# Patient Record
Sex: Male | Born: 2000 | Race: Black or African American | Hispanic: No | Marital: Single | Smoking: Never smoker
Health system: Southern US, Community
[De-identification: ages and names within clinical notes are randomized; demographics above are authoritative.]

## PROBLEM LIST (undated history)

## (undated) DIAGNOSIS — E119 Type 2 diabetes mellitus without complications: Secondary | ICD-10-CM

## (undated) DIAGNOSIS — I2699 Other pulmonary embolism without acute cor pulmonale: Secondary | ICD-10-CM

## (undated) HISTORY — DX: Type 2 diabetes mellitus without complications: E11.9

## (undated) HISTORY — DX: Other pulmonary embolism without acute cor pulmonale: I26.99

---
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
## 2018-04-03 ENCOUNTER — Emergency Department
Admission: EM | Admit: 2018-04-03 | Discharge: 2018-04-03 | Disposition: A | Discharge status: Home / Self Care | Payer: Medicaid Other | Attending: Emergency Medicine | Admitting: Emergency Medicine

## 2018-04-03 ENCOUNTER — Other Ambulatory Visit: Payer: Self-pay

## 2018-04-03 ENCOUNTER — Encounter: Payer: Self-pay | Admitting: Emergency Medicine

## 2018-04-03 DIAGNOSIS — R1032 Left lower quadrant pain: Secondary | ICD-10-CM

## 2018-04-03 DIAGNOSIS — Y929 Unspecified place or not applicable: Secondary | ICD-10-CM | POA: Insufficient documentation

## 2018-04-03 DIAGNOSIS — X500XXA Overexertion from strenuous movement or load, initial encounter: Secondary | ICD-10-CM | POA: Insufficient documentation

## 2018-04-03 DIAGNOSIS — Y990 Civilian activity done for income or pay: Secondary | ICD-10-CM | POA: Insufficient documentation

## 2018-04-03 DIAGNOSIS — S39011A Strain of muscle, fascia and tendon of abdomen, initial encounter: Secondary | ICD-10-CM | POA: Insufficient documentation

## 2018-04-03 DIAGNOSIS — Y9389 Activity, other specified: Secondary | ICD-10-CM | POA: Insufficient documentation

## 2018-04-03 LAB — URINALYSIS, COMPLETE (UACMP) WITH MICROSCOPIC
BACTERIA UA: NONE SEEN
BILIRUBIN URINE: NEGATIVE
Glucose, UA: NEGATIVE mg/dL
Hgb urine dipstick: NEGATIVE
KETONES UR: NEGATIVE mg/dL
Nitrite: NEGATIVE
Protein, ur: NEGATIVE mg/dL
Specific Gravity, Urine: 1.023 (ref 1.005–1.030)
pH: 7 (ref 5.0–8.0)

## 2018-04-03 LAB — COMPREHENSIVE METABOLIC PANEL
ALK PHOS: 68 U/L (ref 38–126)
ALT: 19 U/L (ref 0–44)
AST: 19 U/L (ref 15–41)
Albumin: 4.4 g/dL (ref 3.5–5.0)
Anion gap: 9 (ref 5–15)
BUN: 10 mg/dL (ref 6–20)
CHLORIDE: 104 mmol/L (ref 98–111)
CO2: 24 mmol/L (ref 22–32)
CREATININE: 0.62 mg/dL (ref 0.61–1.24)
Calcium: 9.7 mg/dL (ref 8.9–10.3)
GFR calc non Af Amer: >60 mL/min (ref >60)
GLUCOSE: 92 mg/dL (ref 70–99)
Potassium: 4.1 mmol/L (ref 3.5–5.1)
SODIUM: 137 mmol/L (ref 135–145)
Total Bilirubin: 0.7 mg/dL (ref 0.3–1.2)
Total Protein: 8.2 g/dL — ABNORMAL HIGH (ref 6.5–8.1)

## 2018-04-03 LAB — CBC
HEMATOCRIT: 45.9 % (ref 39.0–52.0)
Hemoglobin: 15.7 g/dL (ref 13.0–17.0)
MCH: 30.7 pg (ref 26.0–34.0)
MCHC: 34.2 g/dL (ref 30.0–36.0)
MCV: 89.8 fL (ref 80.0–100.0)
NRBC: 0 % (ref 0.0–0.2)
Platelets: 250 10*3/uL (ref 150–400)
RBC: 5.11 MIL/uL (ref 4.22–5.81)
RDW: 11.5 % (ref 11.5–15.5)
WBC: 4.8 10*3/uL (ref 4.0–10.5)

## 2018-04-03 LAB — LIPASE, BLOOD: LIPASE: 21 U/L (ref 11–51)

## 2018-04-03 NOTE — ED Provider Notes (Signed)
[facility] Emergency Department Provider Note  ____________________________________________  Time seen: Approximately 7:05 PM  I have reviewed the triage vital signs and the nursing notes.   HISTORY  Chief Complaint Abdominal Pain    HPI Tony Malone is a 18 y.o. male with no significant past medical history who complains of left lower quadrant abdominal pain that feels sharp, intermittent, worse with movement and twisting motion of the abdomen.  Started 3 days ago, waxing and waning since then.  No alleviating factors.  Associated with diarrhea that is not black or bloody.  Denies vomiting, normal oral intake.  No fevers chills or body aches.  No known sick contacts.  Pain started after he was reaching up high to grab a heavy object.  He works loading and unloading freight trucks.      History reviewed. No pertinent past medical history.   There are no active problems to display for this patient.    History reviewed. No pertinent surgical history.   Prior to Admission medications   Not on File   None  Allergies Patient has no known allergies.   No family history on file.  Social History Social History   Tobacco Use  . Smoking status: Never Smoker  Substance Use Topics  . Alcohol use: Not on file  . Drug use: No    Review of Systems  Constitutional:   No fever or chills.  ENT:   No sore throat. No rhinorrhea. Cardiovascular:   No chest pain or syncope. Respiratory:   No dyspnea or cough. Gastrointestinal: Positive as above for abdominal pain and diarrhea.  No vomiting Musculoskeletal:   Negative for focal pain or swelling All other systems reviewed and are negative except as documented above in ROS and HPI.  ____________________________________________   PHYSICAL EXAM:  VITAL SIGNS: ED Triage Vitals  Enc Vitals Group     BP 04/03/18 1518 138/79     Pulse Rate 04/03/18 1518 76     Resp 04/03/18 1518 17     Temp  04/03/18 1518 98.4 F (36.9 C)     Temp Source 04/03/18 1518 Oral     SpO2 04/03/18 1518 95 %     Weight 04/03/18 1517 290 lb (131.5 kg)     Height 04/03/18 1517 5\' 9"  (1.753 m)     Head Circumference --      Peak Flow --      Pain Score 04/03/18 1517 8     Pain Loc --      Pain Edu? --      Excl. in GC? --     Vital signs reviewed, nursing assessments reviewed.   Constitutional:   Alert and oriented. Non-toxic appearance. Eyes:   Conjunctivae are normal. EOMI. ENT      Head:   Normocephalic and atraumatic.      Mouth/Throat:   MMM      Neck:   No meningismus. Full ROM. Hematological/Lymphatic/Immunilogical:   No cervical lymphadenopathy. Cardiovascular:   RRR. Symmetric bilateral radial and DP pulses.  No murmurs. Cap refill less than 2 seconds. Respiratory:   Normal respiratory effort without tachypnea/retractions. Breath sounds are clear and equal bilaterally. No wheezes/rales/rhonchi. Gastrointestinal:   Soft with tenderness in the left lower quadrant.  There is no tenderness with superficial palpation of the soft tissues, no inflammatory changes of the abdominal wall.  No tenderness with deep palpation of the left lower quadrant structures.  Seems to localize to the abdominal wall muscular  layer.  Pain is reproducible with twisting motion or with having the patient flex the left hip against resistance.. Non distended. There is no CVA tenderness.  No rebound, rigidity, or guarding. Musculoskeletal:   Normal range of motion in all extremities.  No edema. Neurologic:   Normal speech and language.  Motor grossly intact. No acute focal neurologic deficits are appreciated.  Skin:    Skin is warm, dry and intact. No rash noted.  No wounds.  ____________________________________________    LABS (pertinent positives/negatives) (all labs ordered are listed, but only abnormal results are displayed) Labs Reviewed  COMPREHENSIVE METABOLIC PANEL - Abnormal; Notable for the following  components:      Result Value   Total Protein 8.2 (*)    All other components within normal limits  URINALYSIS, COMPLETE (UACMP) WITH MICROSCOPIC - Abnormal; Notable for the following components:   Color, Urine YELLOW (*)    APPearance CLEAR (*)    Leukocytes,Ua TRACE (*)    All other components within normal limits  LIPASE, BLOOD  CBC   ____________________________________________   EKG  ____________________________________________    RADIOLOGY  No results found.  ____________________________________________   PROCEDURES Procedures  ____________________________________________  CLINICAL IMPRESSION / ASSESSMENT AND PLAN / ED COURSE  Pertinent labs & imaging results that were available during my care of the patient were reviewed by me and considered in my medical decision making (see chart for details).    Patient presents with left lower quadrant abdominal pain.  Considering the patient's symptoms, medical history, and physical examination today, I have low suspicion for cholecystitis or biliary pathology, pancreatitis, perforation or bowel obstruction, hernia, intra-abdominal abscess, AAA or dissection, volvulus or intussusception, mesenteric ischemia, or appendicitis.  Likely abdominal wall strain, treat with NSAIDs and follow-up with primary care.  Work note provided.        ____________________________________________   FINAL CLINICAL IMPRESSION(S) / ED DIAGNOSES    Final diagnoses:  Left lower quadrant abdominal pain  Abdominal wall strain, initial encounter     ED Discharge Orders    None      Portions of this note were generated with dragon dictation software. Dictation errors may occur despite best attempts at proofreading.   Sharman Cheek, MD 04/03/18 Windell Moment

## 2018-04-03 NOTE — ED Triage Notes (Signed)
PT arrives with complaints of left lower quadrant pain that feels sharp. Pt states pain started 3 days ago. Pt also reports diarrhea.

## 2019-07-02 ENCOUNTER — Emergency Department: Payer: Self-pay

## 2019-07-02 ENCOUNTER — Other Ambulatory Visit: Payer: Self-pay

## 2019-07-02 ENCOUNTER — Emergency Department
Admission: EM | Admit: 2019-07-02 | Discharge: 2019-07-02 | Disposition: A | Discharge status: Home / Self Care | Payer: Self-pay | Attending: Emergency Medicine | Admitting: Emergency Medicine

## 2019-07-02 DIAGNOSIS — J209 Acute bronchitis, unspecified: Secondary | ICD-10-CM | POA: Insufficient documentation

## 2019-07-02 DIAGNOSIS — R079 Chest pain, unspecified: Secondary | ICD-10-CM

## 2019-07-02 LAB — CBC
HCT: 45.9 % (ref 39.0–52.0)
Hemoglobin: 15.4 g/dL (ref 13.0–17.0)
MCH: 30.6 pg (ref 26.0–34.0)
MCHC: 33.6 g/dL (ref 30.0–36.0)
MCV: 91.1 fL (ref 80.0–100.0)
Platelets: 286 10*3/uL (ref 150–400)
RBC: 5.04 MIL/uL (ref 4.22–5.81)
RDW: 11.9 % (ref 11.5–15.5)
WBC: 6.5 10*3/uL (ref 4.0–10.5)
nRBC: 0 % (ref 0.0–0.2)

## 2019-07-02 LAB — BASIC METABOLIC PANEL
Anion gap: 9 (ref 5–15)
BUN: 15 mg/dL (ref 6–20)
CO2: 25 mmol/L (ref 22–32)
Calcium: 9 mg/dL (ref 8.9–10.3)
Chloride: 102 mmol/L (ref 98–111)
Creatinine, Ser: 0.8 mg/dL (ref 0.61–1.24)
GFR calc Af Amer: >60 mL/min (ref >60)
GFR calc non Af Amer: >60 mL/min (ref >60)
Glucose, Bld: 117 mg/dL — ABNORMAL HIGH (ref 70–99)
Potassium: 3.9 mmol/L (ref 3.5–5.1)
Sodium: 136 mmol/L (ref 135–145)

## 2019-07-02 LAB — TROPONIN I (HIGH SENSITIVITY): Troponin I (High Sensitivity): 3 ng/L (ref <18)

## 2019-07-02 MED ORDER — PREDNISONE 50 MG PO TABS: 50.0000 mg | ORAL_TABLET | Freq: Every day | ORAL | 0 refills | Status: DC

## 2019-07-02 MED ORDER — PREDNISONE 20 MG PO TABS
50.0000 mg | ORAL_TABLET | Freq: Once | ORAL | Status: AC
  Administered 2019-07-02: 50 mg via ORAL
  Filled 2019-07-02: qty 3

## 2019-07-02 MED ORDER — IPRATROPIUM-ALBUTEROL 0.5-2.5 (3) MG/3ML IN SOLN
3.0000 mL | Freq: Once | RESPIRATORY_TRACT | Status: AC
  Administered 2019-07-02: 3 mL via RESPIRATORY_TRACT
  Filled 2019-07-02: qty 3

## 2019-07-02 MED ORDER — ALBUTEROL SULFATE HFA 108 (90 BASE) MCG/ACT IN AERS
2.0000 | INHALATION_SPRAY | Freq: Four times a day (QID) | RESPIRATORY_TRACT | 0 refills | Status: DC | PRN
Start: 2019-07-02 — End: 2021-04-08

## 2019-07-02 MED ORDER — SODIUM CHLORIDE 0.9% FLUSH: 3.0000 mL | Freq: Once | INTRAVENOUS | Status: DC

## 2019-07-02 NOTE — ED Triage Notes (Signed)
Pt presents with c/o SOB that has been going on for 3 days. Pt states cough and pain in chest when he takes deep breath.

## 2019-07-02 NOTE — ED Provider Notes (Signed)
[facility] Emergency Department Provider Note   ____________________________________________    I have reviewed the triage vital signs and the nursing notes.   HISTORY  Chief Complaint Shortness of Breath     HPI Tony Malone is a 19 y.o. male who presents with complaints of cough and mild shortness of breath with exertion over the last several days.  He does not smoke.  Does not have a history of asthma although does report a history of bronchitis 1 year ago that was similar to this.  Does not think that he has had a fever, no chills.  No nausea vomiting or chest pain.  No recent travel.  No calf pain or swelling  History reviewed. No pertinent past medical history.  There are no problems to display for this patient.   History reviewed. No pertinent surgical history.  Prior to Admission medications   Medication Sig Start Date End Date Taking? Authorizing Provider  albuterol (VENTOLIN HFA) 108 (90 Base) MCG/ACT inhaler Inhale 2 puffs into the lungs every 6 (six) hours as needed for wheezing or shortness of breath. 07/02/19   Lavonia Drafts, MD  predniSONE (DELTASONE) 50 MG tablet Take 1 tablet (50 mg total) by mouth daily with breakfast. 07/02/19   Lavonia Drafts, MD     Allergies Patient has no known allergies.  No family history on file.  Social History Social History   Tobacco Use  . Smoking status: Never Smoker  Substance Use Topics  . Alcohol use: Not on file  . Drug use: No    Review of Systems  Constitutional: No fever/chills Eyes: No visual changes.  ENT: No sore throat. Cardiovascular: As above Respiratory: As above Gastrointestinal: No abdominal pain.  Genitourinary: Negative for dysuria. Musculoskeletal: Negative for back pain. Skin: Negative for rash. Neurological: Negative for headaches   ____________________________________________   PHYSICAL EXAM:  VITAL SIGNS: ED Triage Vitals [07/02/19 1021]  Enc  Vitals Group     BP (!) 141/66     Pulse Rate 100     Resp 16     Temp 99.2 F (37.3 C)     Temp Source Oral     SpO2 95 %     Weight      Height      Head Circumference      Peak Flow      Pain Score 4     Pain Loc      Pain Edu?      Excl. in Day?     Constitutional: Alert and oriented.  Nose: No congestion/rhinnorhea. Mouth/Throat: Mucous membranes are moist.    Cardiovascular: Normal rate, regular rhythm. Grossly normal heart sounds.  Good peripheral circulation. Respiratory: Normal respiratory effort.  No retractions.  Scattered mild wheezing Gastrointestinal: Soft and nontender. No distention.  No CVA tenderness.  Musculoskeletal: No lower extremity tenderness nor edema.  Warm and well perfused Neurologic:  Normal speech and language. No gross focal neurologic deficits are appreciated.  Skin:  Skin is warm, dry and intact. No rash noted. Psychiatric: Mood and affect are normal. Speech and behavior are normal.  ____________________________________________   LABS (all labs ordered are listed, but only abnormal results are displayed)  Labs Reviewed  BASIC METABOLIC PANEL - Abnormal; Notable for the following components:      Result Value   Glucose, Bld 117 (*)    All other components within normal limits  CBC  TROPONIN I (HIGH SENSITIVITY)   ____________________________________________  EKG  ED ECG REPORT I, Jene Every, the attending physician, personally viewed and interpreted this ECG.  Date: 07/02/2019  Rhythm: Sinus tachycardia QRS Axis: normal Intervals: normal ST/T Wave abnormalities: normal Narrative Interpretation: no evidence of acute ischemia  ____________________________________________  RADIOLOGY  Chest x-ray reviewed by me, mild bronchitic changes ____________________________________________   PROCEDURES  Procedure(s) performed: No  Procedures   Critical Care performed:  No ____________________________________________   INITIAL IMPRESSION / ASSESSMENT AND PLAN / ED COURSE  Pertinent labs & imaging results that were available during my care of the patient were reviewed by me and considered in my medical decision making (see chart for details).  Patient overall well-appearing and in no acute distress vital signs are reassuring.  Scattered wheezes on exam.  Differential includes bronchospasm, bronchitis, pneumonia.  Lab work is quite reassuring, chest x-ray consistent with likely bronchitic changes, treated with DuoNeb with significant improvement, will start on steroids as well.  Outpatient follow-up, return precautions discussed    ____________________________________________   FINAL CLINICAL IMPRESSION(S) / ED DIAGNOSES  Final diagnoses:  Acute bronchitis with bronchospasm        Note:  This document was prepared using Dragon voice recognition software and may include unintentional dictation errors.   Jene Every, MD 07/02/19 1422

## 2019-07-02 NOTE — ED Notes (Signed)
See triage note  States he developed some SOB several days  States he had a fever at first   But is afebrile on arrival to ED

## 2021-04-04 ENCOUNTER — Emergency Department: Payer: Self-pay

## 2021-04-04 ENCOUNTER — Inpatient Hospital Stay
Admission: EM | Admit: 2021-04-04 | Discharge: 2021-04-08 | DRG: 163 | Disposition: A | Discharge status: Home / Self Care | Payer: Self-pay | Attending: Internal Medicine | Admitting: Internal Medicine

## 2021-04-04 ENCOUNTER — Encounter: Payer: Self-pay | Admitting: Emergency Medicine

## 2021-04-04 ENCOUNTER — Other Ambulatory Visit: Payer: Self-pay

## 2021-04-04 DIAGNOSIS — R739 Hyperglycemia, unspecified: Principal | ICD-10-CM

## 2021-04-04 DIAGNOSIS — E860 Dehydration: Secondary | ICD-10-CM | POA: Diagnosis present

## 2021-04-04 DIAGNOSIS — I2699 Other pulmonary embolism without acute cor pulmonale: Secondary | ICD-10-CM

## 2021-04-04 DIAGNOSIS — R079 Chest pain, unspecified: Secondary | ICD-10-CM | POA: Diagnosis present

## 2021-04-04 DIAGNOSIS — Z825 Family history of asthma and other chronic lower respiratory diseases: Secondary | ICD-10-CM

## 2021-04-04 DIAGNOSIS — M7989 Other specified soft tissue disorders: Secondary | ICD-10-CM | POA: Diagnosis present

## 2021-04-04 DIAGNOSIS — R3589 Other polyuria: Secondary | ICD-10-CM | POA: Diagnosis present

## 2021-04-04 DIAGNOSIS — D72829 Elevated white blood cell count, unspecified: Secondary | ICD-10-CM | POA: Diagnosis present

## 2021-04-04 DIAGNOSIS — R0902 Hypoxemia: Secondary | ICD-10-CM | POA: Diagnosis present

## 2021-04-04 DIAGNOSIS — E871 Hypo-osmolality and hyponatremia: Secondary | ICD-10-CM | POA: Diagnosis present

## 2021-04-04 DIAGNOSIS — R000 Tachycardia, unspecified: Secondary | ICD-10-CM

## 2021-04-04 DIAGNOSIS — E876 Hypokalemia: Secondary | ICD-10-CM

## 2021-04-04 DIAGNOSIS — E669 Obesity, unspecified: Secondary | ICD-10-CM

## 2021-04-04 DIAGNOSIS — R0602 Shortness of breath: Secondary | ICD-10-CM | POA: Diagnosis present

## 2021-04-04 DIAGNOSIS — Z20822 Contact with and (suspected) exposure to covid-19: Secondary | ICD-10-CM | POA: Diagnosis present

## 2021-04-04 DIAGNOSIS — I2609 Other pulmonary embolism with acute cor pulmonale: Principal | ICD-10-CM

## 2021-04-04 DIAGNOSIS — Z79899 Other long term (current) drug therapy: Secondary | ICD-10-CM

## 2021-04-04 DIAGNOSIS — Z7952 Long term (current) use of systemic steroids: Secondary | ICD-10-CM

## 2021-04-04 DIAGNOSIS — I513 Intracardiac thrombosis, not elsewhere classified: Secondary | ICD-10-CM

## 2021-04-04 DIAGNOSIS — E1110 Type 2 diabetes mellitus with ketoacidosis without coma: Secondary | ICD-10-CM | POA: Diagnosis present

## 2021-04-04 DIAGNOSIS — R6510 Systemic inflammatory response syndrome (SIRS) of non-infectious origin without acute organ dysfunction: Secondary | ICD-10-CM | POA: Diagnosis present

## 2021-04-04 DIAGNOSIS — M79606 Pain in leg, unspecified: Secondary | ICD-10-CM | POA: Diagnosis present

## 2021-04-04 DIAGNOSIS — Z6841 Body Mass Index (BMI) 40.0 and over, adult: Secondary | ICD-10-CM

## 2021-04-04 DIAGNOSIS — R631 Polydipsia: Secondary | ICD-10-CM | POA: Diagnosis present

## 2021-04-04 DIAGNOSIS — K219 Gastro-esophageal reflux disease without esophagitis: Secondary | ICD-10-CM | POA: Diagnosis present

## 2021-04-04 LAB — BASIC METABOLIC PANEL
Anion gap: 18 — ABNORMAL HIGH (ref 5–15)
Anion gap: 19 — ABNORMAL HIGH (ref 5–15)
BUN: 9 mg/dL (ref 6–20)
BUN: 8 mg/dL (ref 6–20)
CO2: 13 mmol/L — ABNORMAL LOW (ref 22–32)
CO2: 13 mmol/L — ABNORMAL LOW (ref 22–32)
Calcium: 9.1 mg/dL (ref 8.9–10.3)
Calcium: 8.8 mg/dL — ABNORMAL LOW (ref 8.9–10.3)
Chloride: 99 mmol/L (ref 98–111)
Chloride: 103 mmol/L (ref 98–111)
Creatinine, Ser: 0.73 mg/dL (ref 0.61–1.24)
Creatinine, Ser: 0.8 mg/dL (ref 0.61–1.24)
GFR, Estimated: >60 mL/min (ref >60)
GFR, Estimated: >60 mL/min (ref >60)
Glucose, Bld: 347 mg/dL — ABNORMAL HIGH (ref 70–99)
Glucose, Bld: 283 mg/dL — ABNORMAL HIGH (ref 70–99)
Potassium: 4.1 mmol/L (ref 3.5–5.1)
Potassium: 3.9 mmol/L (ref 3.5–5.1)
Sodium: 130 mmol/L — ABNORMAL LOW (ref 135–145)
Sodium: 135 mmol/L (ref 135–145)

## 2021-04-04 LAB — BLOOD GAS, VENOUS
Acid-base deficit: 12.7 mmol/L — ABNORMAL HIGH (ref 0.0–2.0)
Bicarbonate: 13.9 mmol/L — ABNORMAL LOW (ref 20.0–28.0)
O2 Saturation: 81.7 %
Patient temperature: 37
pCO2, Ven: 34 mmHg — ABNORMAL LOW (ref 44–60)
pH, Ven: 7.22 — ABNORMAL LOW (ref 7.25–7.43)
pO2, Ven: 53 mmHg — ABNORMAL HIGH (ref 32–45)

## 2021-04-04 LAB — GLUCOSE, CAPILLARY
Glucose-Capillary: 199 mg/dL — ABNORMAL HIGH (ref 70–99)
Glucose-Capillary: 210 mg/dL — ABNORMAL HIGH (ref 70–99)
Glucose-Capillary: 281 mg/dL — ABNORMAL HIGH (ref 70–99)

## 2021-04-04 LAB — CBC WITH DIFFERENTIAL/PLATELET
Abs Immature Granulocytes: 0.15 10*3/uL — ABNORMAL HIGH (ref 0.00–0.07)
Basophils Absolute: 0.1 10*3/uL (ref 0.0–0.1)
Basophils Relative: 1 %
Eosinophils Absolute: 0.1 10*3/uL (ref 0.0–0.5)
Eosinophils Relative: 1 %
HCT: 46.4 % (ref 39.0–52.0)
Hemoglobin: 15.6 g/dL (ref 13.0–17.0)
Immature Granulocytes: 1 %
Lymphocytes Relative: 25 %
Lymphs Abs: 2.9 10*3/uL (ref 0.7–4.0)
MCH: 29.9 pg (ref 26.0–34.0)
MCHC: 33.6 g/dL (ref 30.0–36.0)
MCV: 88.9 fL (ref 80.0–100.0)
Monocytes Absolute: 1 10*3/uL (ref 0.1–1.0)
Monocytes Relative: 8 %
Neutro Abs: 7.7 10*3/uL (ref 1.7–7.7)
Neutrophils Relative %: 64 %
Platelets: 218 10*3/uL (ref 150–400)
RBC: 5.22 MIL/uL (ref 4.22–5.81)
RDW: 12 % (ref 11.5–15.5)
WBC: 11.8 10*3/uL — ABNORMAL HIGH (ref 4.0–10.5)
nRBC: 0 % (ref 0.0–0.2)

## 2021-04-04 LAB — CBC
HCT: 48.1 % (ref 39.0–52.0)
Hemoglobin: 16.2 g/dL (ref 13.0–17.0)
MCH: 29.7 pg (ref 26.0–34.0)
MCHC: 33.7 g/dL (ref 30.0–36.0)
MCV: 88.3 fL (ref 80.0–100.0)
Platelets: 211 10*3/uL (ref 150–400)
RBC: 5.45 MIL/uL (ref 4.22–5.81)
RDW: 11.9 % (ref 11.5–15.5)
WBC: 11.8 10*3/uL — ABNORMAL HIGH (ref 4.0–10.5)
nRBC: 0 % (ref 0.0–0.2)

## 2021-04-04 LAB — HEPATIC FUNCTION PANEL
ALT: 39 U/L (ref 0–44)
AST: 20 U/L (ref 15–41)
Albumin: 3.9 g/dL (ref 3.5–5.0)
Alkaline Phosphatase: 81 U/L (ref 38–126)
Bilirubin, Direct: 0.1 mg/dL (ref 0.0–0.2)
Indirect Bilirubin: 1 mg/dL — ABNORMAL HIGH (ref 0.3–0.9)
Total Bilirubin: 1.1 mg/dL (ref 0.3–1.2)
Total Protein: 8.9 g/dL — ABNORMAL HIGH (ref 6.5–8.1)

## 2021-04-04 LAB — URINALYSIS, COMPLETE (UACMP) WITH MICROSCOPIC
Bacteria, UA: NONE SEEN
Bilirubin Urine: NEGATIVE
Glucose, UA: >=500 mg/dL — AB
Hgb urine dipstick: NEGATIVE
Ketones, ur: 80 mg/dL — AB
Leukocytes,Ua: NEGATIVE
Nitrite: NEGATIVE
Protein, ur: 100 mg/dL — AB
Specific Gravity, Urine: 1.026 (ref 1.005–1.030)
pH: 5 (ref 5.0–8.0)

## 2021-04-04 LAB — BETA-HYDROXYBUTYRIC ACID
Beta-Hydroxybutyric Acid: 5.47 mmol/L — ABNORMAL HIGH (ref 0.05–0.27)
Beta-Hydroxybutyric Acid: 5.49 mmol/L — ABNORMAL HIGH (ref 0.05–0.27)

## 2021-04-04 LAB — TSH: TSH: 1.455 u[IU]/mL (ref 0.350–4.500)

## 2021-04-04 LAB — T4, FREE: Free T4: 0.94 ng/dL (ref 0.61–1.12)

## 2021-04-04 LAB — CBG MONITORING, ED: Glucose-Capillary: 309 mg/dL — ABNORMAL HIGH (ref 70–99)

## 2021-04-04 LAB — TROPONIN I (HIGH SENSITIVITY)
Troponin I (High Sensitivity): 6 ng/L (ref <18)
Troponin I (High Sensitivity): 6 ng/L (ref <18)

## 2021-04-04 LAB — RESP PANEL BY RT-PCR (FLU A&B, COVID) ARPGX2
Influenza A by PCR: NEGATIVE
Influenza B by PCR: NEGATIVE
SARS Coronavirus 2 by RT PCR: NEGATIVE

## 2021-04-04 LAB — D-DIMER, QUANTITATIVE: D-Dimer, Quant: 2.25 ug/mL-FEU — ABNORMAL HIGH (ref 0.00–0.50)

## 2021-04-04 LAB — PROCALCITONIN: Procalcitonin: <0.1 ng/mL

## 2021-04-04 LAB — BRAIN NATRIURETIC PEPTIDE: B Natriuretic Peptide: 5.3 pg/mL (ref 0.0–100.0)

## 2021-04-04 MED ORDER — HEPARIN BOLUS VIA INFUSION
5000.0000 [IU] | Freq: Once | INTRAVENOUS | Status: DC
Start: 2021-04-04 — End: 2021-04-04
  Filled 2021-04-04: qty 5000

## 2021-04-04 MED ORDER — HEPARIN (PORCINE) 25000 UT/250ML-% IV SOLN: 1600.0000 [IU]/h | INTRAVENOUS | Status: DC

## 2021-04-04 MED ORDER — ENOXAPARIN SODIUM 80 MG/0.8ML IJ SOSY
0.5000 mg/kg | PREFILLED_SYRINGE | INTRAMUSCULAR | Status: DC
  Filled 2021-04-04: qty 0.65

## 2021-04-04 MED ORDER — POTASSIUM CHLORIDE 10 MEQ/100ML IV SOLN: 10.0000 meq | INTRAVENOUS | Status: DC

## 2021-04-04 MED ORDER — ACETAMINOPHEN 500 MG PO TABS
1000.0000 mg | ORAL_TABLET | Freq: Once | ORAL | Status: AC
Start: 2021-04-05 — End: 2021-04-04
  Administered 2021-04-04: 1000 mg via ORAL
  Filled 2021-04-04: qty 2

## 2021-04-04 MED ORDER — LACTATED RINGERS IV BOLUS
20.0000 mL/kg | Freq: Once | INTRAVENOUS | Status: AC
  Administered 2021-04-04: 2630 mL via INTRAVENOUS

## 2021-04-04 MED ORDER — INSULIN REGULAR(HUMAN) IN NACL 100-0.9 UT/100ML-% IV SOLN
INTRAVENOUS | Status: DC
  Administered 2021-04-04: 19 [IU]/h via INTRAVENOUS
  Administered 2021-04-05: 14 [IU]/h via INTRAVENOUS
  Administered 2021-04-05: 9.5 [IU]/h via INTRAVENOUS
  Filled 2021-04-04 (×3): qty 100

## 2021-04-04 MED ORDER — HEPARIN SODIUM (PORCINE) 5000 UNIT/ML IJ SOLN: 5000.0000 [IU] | Freq: Three times a day (TID) | INTRAMUSCULAR | Status: DC

## 2021-04-04 MED ORDER — PANTOPRAZOLE SODIUM 40 MG IV SOLR
40.0000 mg | Freq: Two times a day (BID) | INTRAVENOUS | Status: DC
  Administered 2021-04-04 – 2021-04-06 (×4): 40 mg via INTRAVENOUS
  Filled 2021-04-04 (×4): qty 10

## 2021-04-04 MED ORDER — LACTATED RINGERS IV SOLN: INTRAVENOUS | Status: DC

## 2021-04-04 MED ORDER — DEXTROSE IN LACTATED RINGERS 5 % IV SOLN
INTRAVENOUS | Status: DC
  Administered 2021-04-05: 125 mL/h via INTRAVENOUS

## 2021-04-04 MED ORDER — INSULIN REGULAR(HUMAN) IN NACL 100-0.9 UT/100ML-% IV SOLN: INTRAVENOUS | Status: DC

## 2021-04-04 MED ORDER — CHLORHEXIDINE GLUCONATE CLOTH 2 % EX PADS
6.0000 | MEDICATED_PAD | Freq: Every day | CUTANEOUS | Status: DC
  Administered 2021-04-04 – 2021-04-07 (×4): 6 via TOPICAL

## 2021-04-04 MED ORDER — IPRATROPIUM-ALBUTEROL 0.5-2.5 (3) MG/3ML IN SOLN
3.0000 mL | Freq: Once | RESPIRATORY_TRACT | Status: AC
  Administered 2021-04-04: 3 mL via RESPIRATORY_TRACT
  Filled 2021-04-04: qty 3

## 2021-04-04 MED ORDER — SODIUM CHLORIDE 0.9 % IV BOLUS
1000.0000 mL | Freq: Once | INTRAVENOUS | Status: AC
  Administered 2021-04-04: 1000 mL via INTRAVENOUS

## 2021-04-04 MED ORDER — POTASSIUM CHLORIDE 10 MEQ/100ML IV SOLN
10.0000 meq | INTRAVENOUS | Status: AC
  Administered 2021-04-04: 10 meq via INTRAVENOUS
  Filled 2021-04-04: qty 100

## 2021-04-04 MED ORDER — DEXTROSE IN LACTATED RINGERS 5 % IV SOLN: INTRAVENOUS | Status: DC

## 2021-04-04 MED ORDER — DEXTROSE 50 % IV SOLN: 0.0000 mL | INTRAVENOUS | Status: DC | PRN

## 2021-04-04 NOTE — ED Notes (Signed)
Provider Harrison, PA okayed not collecting second troponin on this patient. ?

## 2021-04-04 NOTE — ED Notes (Signed)
IV team present  

## 2021-04-04 NOTE — ED Provider Notes (Signed)
? ? ?[facility] ?Emergency Department Provider Note ? ? ? ? Event Date/Time  ? First MD Initiated Contact with Patient 04/04/21 1627   ?  (approximate) ? ? ?History  ? ?Chest Pain and Shortness of Breath ? ? ?HPI ? ?Tony Malone is a 21 y.o. male with noncontributory medical history, presents to the ED for evaluation of central chest pain with some referral to his back, aggravated by inhalation.  Patient denies any history of asthma, COPD, or CHF.  With history of acute bronchitis treated in the past.  Patient denies any recent fevers, chills, cough, or shortness of breath.  He does admit to some episodic nonbloody, nonbilious vomiting yesterday.  Patient would also endorse that last week he was experiencing some increased fatigue, polyuria, nocturia and polydipsia.  Patient presents today in no acute distress for evaluation of his symptoms. ?  ? ? ?Physical Exam  ? ?Triage Vital Signs: ?ED Triage Vitals  ?Enc Vitals Group  ?   BP 04/04/21 1348 (!) 149/95  ?   Pulse Rate 04/04/21 1348 (!) 112  ?   Resp 04/04/21 1348 20  ?   Temp 04/04/21 1348 98.1 ?F (36.7 ?C)  ?   Temp Source 04/04/21 1348 Oral  ?   SpO2 04/04/21 1348 93 %  ?   Weight 04/04/21 1344 289 lb 14.5 oz (131.5 kg)  ?   Height 04/04/21 1344 5\' 9"  (1.753 m)  ?   Head Circumference --   ?   Peak Flow --   ?   Pain Score 04/04/21 1344 8  ?   Pain Loc --   ?   Pain Edu? --   ?   Excl. in GC? --   ? ? ?Most recent vital signs: ?Vitals:  ? 04/04/21 1926 04/04/21 2000  ?BP: (!) 145/95 (!) 163/92  ?Pulse: (!) 122 (!) 112  ?Resp: (!) 24 (!) 22  ?Temp:    ?SpO2: 98% 99%  ? ? ?General Awake, no distress.  ?CV:  Good peripheral perfusion. Tachy rate ?RESP:  Normal effort. CTA ?ABD:  No distention. Soft, nontender ? ?ED Results / Procedures / Treatments  ? ?Labs ?(all labs ordered are listed, but only abnormal results are displayed) ?Labs Reviewed  ?BASIC METABOLIC PANEL - Abnormal; Notable for the following components:  ?    Result Value  ?  Sodium 130 (*)   ? CO2 13 (*)   ? Glucose, Bld 347 (*)   ? Anion gap 18 (*)   ? All other components within normal limits  ?CBC - Abnormal; Notable for the following components:  ? WBC 11.8 (*)   ? All other components within normal limits  ?HEPATIC FUNCTION PANEL - Abnormal; Notable for the following components:  ? Total Protein 8.9 (*)   ? Indirect Bilirubin 1.0 (*)   ? All other components within normal limits  ?BLOOD GAS, VENOUS - Abnormal; Notable for the following components:  ? pH, Ven 7.22 (*)   ? pCO2, Ven 34 (*)   ? pO2, Ven 53 (*)   ? Bicarbonate 13.9 (*)   ? Acid-base deficit 12.7 (*)   ? All other components within normal limits  ?RESP PANEL BY RT-PCR (FLU A&B, COVID) ARPGX2  ?HEMOGLOBIN A1C  ?URINALYSIS, COMPLETE (UACMP) WITH MICROSCOPIC  ?BETA-HYDROXYBUTYRIC ACID  ?CBC WITH DIFFERENTIAL/PLATELET  ?URINALYSIS, ROUTINE W REFLEX MICROSCOPIC  ?CBG MONITORING, ED  ?TROPONIN I (HIGH SENSITIVITY)  ?TROPONIN I (HIGH SENSITIVITY)  ? ? ? ?EKG ? ?  Vent. rate 111 BPM ?PR interval 130 ms ?QRS duration 88 ms ?QT/QTcB 338/459 ms ?P-R-T axes 54 50 25 ?Tachy rate ?Normal axis ?No STEMI ? ?RADIOLOGY ? ?I personally viewed and evaluated these images as part of my medical decision making, as well as reviewing the written report by the radiologist. ? ?ED Provider Interpretation: no acute findings} ? ?DG Chest 2 View ? ?Result Date: 04/04/2021 ?CLINICAL DATA:  Shortness of breath and chest pain. EXAM: CHEST - 2 VIEW COMPARISON:  07/02/2019 FINDINGS: Cardiac silhouette and mediastinal contours are within normal limits. The lungs are clear. No pleural effusion or pneumothorax. No acute skeletal abnormality. IMPRESSION: No active cardiopulmonary disease. Electronically Signed   By: Neita Garnet M.D.   On: 04/04/2021 14:45   ? ? ?PROCEDURES: ? ?Critical Care performed: Yes, see critical care procedure note(s) ? ?Procedures ?CRITICAL CARE ?Performed by: Lissa Hoard ? ? ?Total critical care time: 25 minutes ? ?Critical  care time was exclusive of separately billable procedures and treating other patients. ? ?Critical care was necessary to treat or prevent imminent or life-threatening deterioration. ? ?Critical care was time spent personally by me on the following activities: development of treatment plan with patient and/or surrogate as well as nursing, discussions with consultants, evaluation of patient's response to treatment, examination of patient, obtaining history from patient or surrogate, ordering and performing treatments and interventions, ordering and review of laboratory studies, ordering and review of radiographic studies, pulse oximetry and re-evaluation of patient's condition. ? ? ? ?MEDICATIONS ORDERED IN ED: ?Medications  ?insulin regular, human (MYXREDLIN) 100 units/ 100 mL infusion (has no administration in time range)  ?lactated ringers infusion (has no administration in time range)  ?dextrose 5 % in lactated ringers infusion (has no administration in time range)  ?dextrose 50 % solution 0-50 mL (has no administration in time range)  ?potassium chloride 10 mEq in 100 mL IVPB (has no administration in time range)  ?ipratropium-albuterol (DUONEB) 0.5-2.5 (3) MG/3ML nebulizer solution 3 mL (3 mLs Nebulization Given 04/04/21 1758)  ?sodium chloride 0.9 % bolus 1,000 mL (0 mLs Intravenous Stopped 04/04/21 2004)  ?lactated ringers bolus 2,630 mL (2,630 mLs Intravenous New Bag/Given 04/04/21 2010)  ? ? ? ?IMPRESSION / MDM / ASSESSMENT AND PLAN / ED COURSE  ?I reviewed the triage vital signs and the nursing notes. ?             ?               ? ?Differential diagnosis includes, but is not limited to, hyperglycemia, DKA, HHS, sepsis ? ?The patient is on the cardiac monitor to evaluate for evidence of arrhythmia and/or significant heart rate changes. ? ?Patient to the ED for evaluation of several days of generalized weakness, shortness of breath, and fatigue.  Patient's initial evaluation does reveal a significant  hyperglycemia at 347, anion gap of 18, and a pH of 7.22.  Patient reports has been n.p.o. since yesterday secondary to anorexia and some nausea and vomiting.  Patient's diagnosis is consistent with DKA and what appears to be a new onset type II diabetic. Patient will be mid to the hospital service Renaldo Reel, MD) for further management including insulin infusion.  Patient is is agreeable to the plan of care and admission. ? ?Clinical Course as of 04/04/21 2032  ?Wed Apr 04, 2021  ?1909 Glucose(!): 347 [JM]  ?  ?Clinical Course User Index ?[JM] Satchel Heidinger, Charlesetta Ivory, PA-C  ? ? ? ?FINAL CLINICAL IMPRESSION(S) /  ED DIAGNOSES  ? ?Final diagnoses:  ?Hyperglycemia  ?Diabetic ketoacidosis without coma associated with type 2 diabetes mellitus (HCC)  ? ? ? ?Rx / DC Orders  ? ?ED Discharge Orders   ? ? None  ? ?  ? ? ? ?Note:  This document was prepared using Dragon voice recognition software and may include unintentional dictation errors. ? ?  ?Lissa HoardMenshew, Heela Heishman V Bacon, PA-C ?04/04/21 2032 ? ?  ?Minna AntisPaduchowski, Kevin, MD ?04/04/21 2336 ? ?

## 2021-04-04 NOTE — ED Triage Notes (Signed)
Pt comes into the ED via POV c/o central chest pain with increasing pain in the center of his back when he has inhalation.  Pt denies any asthma, COPD, or CHF.  Pt denies any recent coughs, etc.  Pt does admit he was vomiting yesterday.  Pt denies any cardiac history.  Pt in NAD with even and unlabored respirations.  ?

## 2021-04-04 NOTE — Progress Notes (Signed)
Cross Cover ?Dose of tylenol ordered for complaints of headache ?

## 2021-04-04 NOTE — ED Provider Notes (Signed)
----------------------------------------- ?  8:03 PM on 04/04/2021 ?----------------------------------------- ?I have personally seen and evaluated the patient in conjunction with Jenise Menshaw.  Patient appears to have new onset diabetes with DKA.  VBG pH is 7.22, elevated anion gap and a blood glucose of 347.  During my evaluation patient is tachypneic with Kussmaul type respirations.  We will start the patient on insulin infusion continue with IV hydration.  Patient will need to be admitted to the hospital service for further work-up treatment as well as diabetic education.  Patient agreeable to plan of care. ? ?CRITICAL CARE ?Performed by: Minna Antis ? ? ?Total critical care time: 30 minutes ? ?Critical care time was exclusive of separately billable procedures and treating other patients. ? ?Critical care was necessary to treat or prevent imminent or life-threatening deterioration. ? ?Critical care was time spent personally by me on the following activities: development of treatment plan with patient and/or surrogate as well as nursing, discussions with consultants, evaluation of patient's response to treatment, examination of patient, obtaining history from patient or surrogate, ordering and performing treatments and interventions, ordering and review of laboratory studies, ordering and review of radiographic studies, pulse oximetry and re-evaluation of patient's condition. ? ?  ?Minna Antis, MD ?04/04/21 2005 ? ?

## 2021-04-04 NOTE — H&P (Signed)
?History and Physical  ? ? ?Patient: Tony Malone QAS:[id number] DOB: January 07, 2001 ?DOA: 04/04/2021 ?DOS: the patient was seen and examined on 04/04/2021 ?PCP: Patient, No Pcp Per (Inactive)  ?Patient coming from: Home ? ?Chief Complaint:  ?Chief Complaint  ?Patient presents with  ? Chest Pain  ? Shortness of Breath  ? ?HPI: Tony Malone is a 21 y.o. male with no significant past medical history present with chest pain, shortness of breath found to be in diabetic ketoacidosis in the emergency room with hyperglycemia elevated anion gap low bicarb and elevated beta hydroxybutyrate level. ?Patient states that his symptoms started about 2 to 3 weeks ago and he had multiple symptoms going on.  Patient also reports that he had Polyuria and polydipsia: ?Since 2 weeks ago. A/A factor none.  ?Sob: ?SOB with talking a lot.  ?Pt says he was told he had bronchitis on June. ?Chest pain: ?With deep breath. ?No travel.  ?Overall patient is a fair historian is little anxious, morbidly obese ill-appearing anxious appearing states that he has bronchitis at this time of the year last year he had a similar episode where he had been treated by PCP for bronchitis. ?Patient denies any alcohol drugs tobacco.  Family history significant for mom having multiple health issues that he does not know of and dad is deceased. ? ? ?Review of Systems: Review of Systems  ?Constitutional:  Negative for malaise/fatigue.  ?Respiratory:  Positive for shortness of breath.   ?Cardiovascular:  Positive for chest pain and leg swelling.  ?Genitourinary:  Positive for frequency.  ?Neurological:  Positive for headaches.  ?Endo/Heme/Allergies:  Positive for polydipsia.  ?All other systems reviewed and are negative. ? ?History reviewed. No pertinent past medical history. ?History reviewed. No pertinent surgical history. ?Social History:  reports that he has never smoked. He does not have any smokeless tobacco history on file. He reports that he does not use  drugs. No history on file for alcohol use. ? ?No Known Allergies ? ?History reviewed. No pertinent family history. ? ?Prior to Admission medications   ?Medication Sig Start Date End Date Taking? Authorizing Provider  ?albuterol (VENTOLIN HFA) 108 (90 Base) MCG/ACT inhaler Inhale 2 puffs into the lungs every 6 (six) hours as needed for wheezing or shortness of breath. 07/02/19   Jene Every, MD  ?predniSONE (DELTASONE) 50 MG tablet Take 1 tablet (50 mg total) by mouth daily with breakfast. 07/02/19   Jene Every, MD  ? ? ?Physical Exam: ?Vitals:  ? 04/04/21 1737 04/04/21 1926 04/04/21 2000 04/04/21 2145  ?BP: (!) 152/95 (!) 145/95 (!) 163/92   ?Pulse: (!) 106 (!) 122 (!) 112 (!) 118  ?Resp: 18 (!) 24 (!) 22   ?Temp:      ?TempSrc:      ?SpO2: 98% 98% 99% 98%  ?Weight:      ?Height:      ?Physical Exam ?Vitals (exam of abdomen is limited due to obesity.) and nursing note reviewed.  ?Constitutional:   ?   General: He is not in acute distress. ?   Appearance: Normal appearance. He is not ill-appearing, toxic-appearing or diaphoretic.  ?HENT:  ?   Head: Normocephalic and atraumatic.  ?   Right Ear: Hearing and external ear normal.  ?   Left Ear: Hearing and external ear normal.  ?   Nose: Nose normal. No nasal deformity.  ?   Mouth/Throat:  ?   Lips: Pink.  ?   Mouth: Mucous membranes are moist.  ?  Tongue: No lesions.  ?   Pharynx: Oropharynx is clear.  ?Eyes:  ?   Extraocular Movements: Extraocular movements intact.  ?   Pupils: Pupils are equal, round, and reactive to light.  ?Neck:  ?   Vascular: No carotid bruit.  ?Cardiovascular:  ?   Rate and Rhythm: Normal rate and regular rhythm.  ?   Pulses: Normal pulses.  ?   Heart sounds: Normal heart sounds.  ?Pulmonary:  ?   Effort: Pulmonary effort is normal.  ?   Breath sounds: Normal breath sounds.  ?   Comments: 04/04/21 19:25 ?pH, Ven: 7.22 (L) ?pCO2, Ven: 34 (L) ?pO2, Ven: 53 (H) ?Acid-base deficit: 12.7 (H) ?Bicarbonate: 13.9 (L) ?O2 Saturation: 81.7 ?Patient  temperature: 37.0 ? ?Abdominal:  ?   General: Bowel sounds are normal. There is no distension.  ?   Palpations: Abdomen is soft. There is no mass.  ?   Tenderness: There is no abdominal tenderness. There is no guarding.  ?   Hernia: No hernia is present.  ?Musculoskeletal:  ?   Right lower leg: No edema.  ?   Left lower leg: No edema.  ?Skin: ?   General: Skin is warm.  ?Neurological:  ?   General: No focal deficit present.  ?   Mental Status: He is alert and oriented to person, place, and time.  ?   Cranial Nerves: Cranial nerves 2-12 are intact.  ?   Motor: Motor function is intact.  ?Psychiatric:     ?   Attention and Perception: Attention normal.     ?   Mood and Affect: Mood is anxious.     ?   Speech: Speech normal.     ?   Behavior: Behavior normal. Behavior is cooperative.     ?   Cognition and Memory: Cognition normal.  ? ?Data Reviewed: ?Results for orders placed or performed during the hospital encounter of 04/04/21 (from the past 24 hour(s))  ?Basic metabolic panel     Status: Abnormal  ? Collection Time: 04/04/21  2:07 PM  ?Result Value Ref Range  ? Sodium 130 (L) 135 - 145 mmol/L  ? Potassium 4.1 3.5 - 5.1 mmol/L  ? Chloride 99 98 - 111 mmol/L  ? CO2 13 (L) 22 - 32 mmol/L  ? Glucose, Bld 347 (H) 70 - 99 mg/dL  ? BUN 9 6 - 20 mg/dL  ? Creatinine, Ser 0.73 0.61 - 1.24 mg/dL  ? Calcium 9.1 8.9 - 10.3 mg/dL  ? GFR, Estimated >60 >60 mL/min  ? Anion gap 18 (H) 5 - 15  ?CBC     Status: Abnormal  ? Collection Time: 04/04/21  2:07 PM  ?Result Value Ref Range  ? WBC 11.8 (H) 4.0 - 10.5 K/uL  ? RBC 5.45 4.22 - 5.81 MIL/uL  ? Hemoglobin 16.2 13.0 - 17.0 g/dL  ? HCT 48.1 39.0 - 52.0 %  ? MCV 88.3 80.0 - 100.0 fL  ? MCH 29.7 26.0 - 34.0 pg  ? MCHC 33.7 30.0 - 36.0 g/dL  ? RDW 11.9 11.5 - 15.5 %  ? Platelets 211 150 - 400 K/uL  ? nRBC 0.0 0.0 - 0.2 %  ?Troponin I (High Sensitivity)     Status: None  ? Collection Time: 04/04/21  2:07 PM  ?Result Value Ref Range  ? Troponin I (High Sensitivity) 6 <18 ng/L  ?Resp Panel  by RT-PCR (Flu A&B, Covid) Nasopharyngeal Swab     Status: None  ? Collection  Time: 04/04/21  6:01 PM  ? Specimen: Nasopharyngeal Swab; Nasopharyngeal(NP) swabs in vial transport medium  ?Result Value Ref Range  ? SARS Coronavirus 2 by RT PCR NEGATIVE NEGATIVE  ? Influenza A by PCR NEGATIVE NEGATIVE  ? Influenza B by PCR NEGATIVE NEGATIVE  ?Troponin I (High Sensitivity)     Status: None  ? Collection Time: 04/04/21  7:25 PM  ?Result Value Ref Range  ? Troponin I (High Sensitivity) 6 <18 ng/L  ?Hepatic function panel     Status: Abnormal  ? Collection Time: 04/04/21  7:25 PM  ?Result Value Ref Range  ? Total Protein 8.9 (H) 6.5 - 8.1 g/dL  ? Albumin 3.9 3.5 - 5.0 g/dL  ? AST 20 15 - 41 U/L  ? ALT 39 0 - 44 U/L  ? Alkaline Phosphatase 81 38 - 126 U/L  ? Total Bilirubin 1.1 0.3 - 1.2 mg/dL  ? Bilirubin, Direct 0.1 0.0 - 0.2 mg/dL  ? Indirect Bilirubin 1.0 (H) 0.3 - 0.9 mg/dL  ?Blood gas, venous     Status: Abnormal  ? Collection Time: 04/04/21  7:25 PM  ?Result Value Ref Range  ? pH, Ven 7.22 (L) 7.25 - 7.43  ? pCO2, Ven 34 (L) 44 - 60 mmHg  ? pO2, Ven 53 (H) 32 - 45 mmHg  ? Bicarbonate 13.9 (L) 20.0 - 28.0 mmol/L  ? Acid-base deficit 12.7 (H) 0.0 - 2.0 mmol/L  ? O2 Saturation 81.7 %  ? Patient temperature 37.0   ?Beta-hydroxybutyric acid     Status: Abnormal  ? Collection Time: 04/04/21  7:25 PM  ?Result Value Ref Range  ? Beta-Hydroxybutyric Acid 5.47 (H) 0.05 - 0.27 mmol/L  ?CBG monitoring, ED     Status: Abnormal  ? Collection Time: 04/04/21  8:55 PM  ?Result Value Ref Range  ? Glucose-Capillary 309 (H) 70 - 99 mg/dL  ? ?Assessment and Plan: ?>>Chest pain/Sinus tachycardia / SOB: ?Admit to stepdown. ?Telemetry and cont pulse oximetry.  ?Troponin in negative/ EKG sinus rhythm. ?Supplemental oxygen as pt is hypoxic on blood gas.  ?D/D include VTE/ CHF/ pulmonary HTN.  ?Prn ntg and morphine/ 2 d echo. ?Suspect OSA and related complication with Pao2 of 53. ? ?>> AMA / DKA: ?Attribute to stress and dehydration.  ?We  will start DKA protocol.  ?We will check AG and transition  ? ?>>SIRs Tachycardia: ?No infection suspected we will get Procalcitonin. ?Echo and CTA chest if d-dimer is not negative as he has risk factors.

## 2021-04-04 NOTE — Progress Notes (Signed)
ANTICOAGULATION CONSULT NOTE ? ?Pharmacy Consult for heparin infusion ?Indication: atrial fibrillation ? ?No Known Allergies ? ?Patient Measurements: ?Height: 5\' 9"  (175.3 cm) ?Weight: 131.5 kg (289 lb 14.5 oz) ?IBW/kg (Calculated) : 70.7 ?Heparin Dosing Weight: 101.3 kg ? ?Vital Signs: ?Temp: 98.1 ?F (36.7 ?C) (03/15 1348) ?Temp Source: Oral (03/15 1348) ?BP: 163/92 (03/15 2000) ?Pulse Rate: 112 (03/15 2000) ? ?Labs: ?Recent Labs  ?  04/04/21 ?1407 04/04/21 ?1925  ?HGB 16.2  --   ?HCT 48.1  --   ?PLT 211  --   ?CREATININE 0.73  --   ?TROPONINIHS 6 6  ? ? ?Estimated Creatinine Clearance: 194.6 mL/min (by C-G formula based on SCr of 0.73 mg/dL). ? ?Assessment:  21 y.o. male with noncontributory medical history, presents to the ED for evaluation of central chest pain. Heparin is being started for new-onset atrial fibrillation.  ? ?Goal of Therapy:  ?Heparin level 0.3-0.7 units/ml ?Monitor platelets by anticoagulation protocol: Yes ?  ?Plan:  ?Give 5000 units bolus x 1 ?Start heparin infusion at 1600 units/hr ?Check anti-Xa level in 6 hours and daily while on heparin ?Continue to monitor H&H and platelets ? ?21 ?04/04/2021,8:51 PM ? ? ?

## 2021-04-05 ENCOUNTER — Encounter
Admission: EM | Disposition: A | Discharge status: Home / Self Care | Payer: Self-pay | Source: Home / Self Care | Attending: Internal Medicine

## 2021-04-05 ENCOUNTER — Inpatient Hospital Stay
Admit: 2021-04-05 | Discharge: 2021-04-05 | Disposition: A | Discharge status: Home / Self Care | Payer: Self-pay | Attending: Internal Medicine | Admitting: Internal Medicine

## 2021-04-05 ENCOUNTER — Inpatient Hospital Stay: Payer: Self-pay

## 2021-04-05 DIAGNOSIS — E876 Hypokalemia: Secondary | ICD-10-CM

## 2021-04-05 DIAGNOSIS — J45909 Unspecified asthma, uncomplicated: Secondary | ICD-10-CM

## 2021-04-05 DIAGNOSIS — R0902 Hypoxemia: Secondary | ICD-10-CM

## 2021-04-05 DIAGNOSIS — K219 Gastro-esophageal reflux disease without esophagitis: Secondary | ICD-10-CM

## 2021-04-05 DIAGNOSIS — I2609 Other pulmonary embolism with acute cor pulmonale: Secondary | ICD-10-CM

## 2021-04-05 DIAGNOSIS — Z794 Long term (current) use of insulin: Secondary | ICD-10-CM

## 2021-04-05 HISTORY — PX: PULMONARY THROMBECTOMY: CATH118295

## 2021-04-05 LAB — BASIC METABOLIC PANEL
Anion gap: 14 (ref 5–15)
Anion gap: 12 (ref 5–15)
Anion gap: 11 (ref 5–15)
Anion gap: 13 (ref 5–15)
BUN: 7 mg/dL (ref 6–20)
BUN: 7 mg/dL (ref 6–20)
BUN: 6 mg/dL (ref 6–20)
BUN: 5 mg/dL — ABNORMAL LOW (ref 6–20)
CO2: 15 mmol/L — ABNORMAL LOW (ref 22–32)
CO2: 19 mmol/L — ABNORMAL LOW (ref 22–32)
CO2: 18 mmol/L — ABNORMAL LOW (ref 22–32)
CO2: 19 mmol/L — ABNORMAL LOW (ref 22–32)
Calcium: 8.8 mg/dL — ABNORMAL LOW (ref 8.9–10.3)
Calcium: 8.8 mg/dL — ABNORMAL LOW (ref 8.9–10.3)
Calcium: 8.6 mg/dL — ABNORMAL LOW (ref 8.9–10.3)
Calcium: 9 mg/dL (ref 8.9–10.3)
Chloride: 104 mmol/L (ref 98–111)
Chloride: 104 mmol/L (ref 98–111)
Chloride: 103 mmol/L (ref 98–111)
Chloride: 103 mmol/L (ref 98–111)
Creatinine, Ser: 0.73 mg/dL (ref 0.61–1.24)
Creatinine, Ser: 0.65 mg/dL (ref 0.61–1.24)
Creatinine, Ser: 0.61 mg/dL (ref 0.61–1.24)
Creatinine, Ser: 0.61 mg/dL (ref 0.61–1.24)
GFR, Estimated: >60 mL/min (ref >60)
GFR, Estimated: >60 mL/min (ref >60)
GFR, Estimated: >60 mL/min (ref >60)
GFR, Estimated: >60 mL/min (ref >60)
Glucose, Bld: 193 mg/dL — ABNORMAL HIGH (ref 70–99)
Glucose, Bld: 188 mg/dL — ABNORMAL HIGH (ref 70–99)
Glucose, Bld: 210 mg/dL — ABNORMAL HIGH (ref 70–99)
Glucose, Bld: 192 mg/dL — ABNORMAL HIGH (ref 70–99)
Potassium: 4 mmol/L (ref 3.5–5.1)
Potassium: 3.5 mmol/L (ref 3.5–5.1)
Potassium: 3.3 mmol/L — ABNORMAL LOW (ref 3.5–5.1)
Potassium: 3.5 mmol/L (ref 3.5–5.1)
Sodium: 133 mmol/L — ABNORMAL LOW (ref 135–145)
Sodium: 135 mmol/L (ref 135–145)
Sodium: 132 mmol/L — ABNORMAL LOW (ref 135–145)
Sodium: 135 mmol/L (ref 135–145)

## 2021-04-05 LAB — GLUCOSE, CAPILLARY
Glucose-Capillary: 171 mg/dL — ABNORMAL HIGH (ref 70–99)
Glucose-Capillary: 180 mg/dL — ABNORMAL HIGH (ref 70–99)
Glucose-Capillary: 214 mg/dL — ABNORMAL HIGH (ref 70–99)
Glucose-Capillary: 183 mg/dL — ABNORMAL HIGH (ref 70–99)
Glucose-Capillary: 197 mg/dL — ABNORMAL HIGH (ref 70–99)
Glucose-Capillary: 176 mg/dL — ABNORMAL HIGH (ref 70–99)
Glucose-Capillary: 190 mg/dL — ABNORMAL HIGH (ref 70–99)
Glucose-Capillary: 216 mg/dL — ABNORMAL HIGH (ref 70–99)
Glucose-Capillary: 177 mg/dL — ABNORMAL HIGH (ref 70–99)
Glucose-Capillary: 175 mg/dL — ABNORMAL HIGH (ref 70–99)
Glucose-Capillary: 175 mg/dL — ABNORMAL HIGH (ref 70–99)
Glucose-Capillary: 179 mg/dL — ABNORMAL HIGH (ref 70–99)
Glucose-Capillary: 190 mg/dL — ABNORMAL HIGH (ref 70–99)
Glucose-Capillary: 184 mg/dL — ABNORMAL HIGH (ref 70–99)
Glucose-Capillary: 187 mg/dL — ABNORMAL HIGH (ref 70–99)
Glucose-Capillary: 202 mg/dL — ABNORMAL HIGH (ref 70–99)
Glucose-Capillary: 199 mg/dL — ABNORMAL HIGH (ref 70–99)
Glucose-Capillary: 348 mg/dL — ABNORMAL HIGH (ref 70–99)
Glucose-Capillary: 239 mg/dL — ABNORMAL HIGH (ref 70–99)
Glucose-Capillary: 249 mg/dL — ABNORMAL HIGH (ref 70–99)

## 2021-04-05 LAB — BETA-HYDROXYBUTYRIC ACID
Beta-Hydroxybutyric Acid: 1.56 mmol/L — ABNORMAL HIGH (ref 0.05–0.27)
Beta-Hydroxybutyric Acid: 1.15 mmol/L — ABNORMAL HIGH (ref 0.05–0.27)

## 2021-04-05 LAB — CBC
HCT: 43.3 % (ref 39.0–52.0)
Hemoglobin: 14.7 g/dL (ref 13.0–17.0)
MCH: 29.6 pg (ref 26.0–34.0)
MCHC: 33.9 g/dL (ref 30.0–36.0)
MCV: 87.1 fL (ref 80.0–100.0)
Platelets: 189 10*3/uL (ref 150–400)
RBC: 4.97 MIL/uL (ref 4.22–5.81)
RDW: 12 % (ref 11.5–15.5)
WBC: 10.6 10*3/uL — ABNORMAL HIGH (ref 4.0–10.5)
nRBC: 0 % (ref 0.0–0.2)

## 2021-04-05 LAB — HIV ANTIBODY (ROUTINE TESTING W REFLEX): HIV Screen 4th Generation wRfx: NONREACTIVE

## 2021-04-05 LAB — PROCALCITONIN: Procalcitonin: <0.1 ng/mL

## 2021-04-05 LAB — MRSA NEXT GEN BY PCR, NASAL: MRSA by PCR Next Gen: NOT DETECTED

## 2021-04-05 LAB — HEPARIN LEVEL (UNFRACTIONATED): Heparin Unfractionated: 0.21 IU/mL — ABNORMAL LOW (ref 0.30–0.70)

## 2021-04-05 SURGERY — PULMONARY THROMBECTOMY
Anesthesia: Moderate Sedation | Laterality: Right

## 2021-04-05 MED ORDER — INSULIN GLARGINE-YFGN 100 UNIT/ML ~~LOC~~ SOLN
40.0000 [IU] | Freq: Every day | SUBCUTANEOUS | Status: DC
  Administered 2021-04-05 – 2021-04-06 (×2): 40 [IU] via SUBCUTANEOUS
  Filled 2021-04-05 (×2): qty 0.4

## 2021-04-05 MED ORDER — INSULIN ASPART 100 UNIT/ML IJ SOLN: 0.0000 [IU] | Freq: Three times a day (TID) | INTRAMUSCULAR | Status: DC

## 2021-04-05 MED ORDER — HEPARIN BOLUS VIA INFUSION
1500.0000 [IU] | Freq: Once | INTRAVENOUS | Status: AC
Start: 2021-04-05 — End: 2021-04-05
  Administered 2021-04-05: 1500 [IU] via INTRAVENOUS
  Filled 2021-04-05: qty 1500

## 2021-04-05 MED ORDER — INSULIN GLARGINE-YFGN 100 UNIT/ML ~~LOC~~ SOLN
40.0000 [IU] | Freq: Every day | SUBCUTANEOUS | Status: DC
Start: 2021-04-05 — End: 2021-04-05
  Filled 2021-04-05: qty 0.4

## 2021-04-05 MED ORDER — ALTEPLASE 1 MG/ML SYRINGE FOR VASCULAR PROCEDURE
INTRAMUSCULAR | Status: DC | PRN
  Administered 2021-04-05: 10 mg via INTRA_ARTERIAL

## 2021-04-05 MED ORDER — FENTANYL CITRATE PF 50 MCG/ML IJ SOSY
PREFILLED_SYRINGE | INTRAMUSCULAR | Status: AC
  Filled 2021-04-05: qty 1

## 2021-04-05 MED ORDER — IODIXANOL 320 MG/ML IV SOLN
INTRAVENOUS | Status: DC | PRN
  Administered 2021-04-05: 130 mL

## 2021-04-05 MED ORDER — ACETAMINOPHEN 325 MG PO TABS
650.0000 mg | ORAL_TABLET | Freq: Four times a day (QID) | ORAL | Status: DC | PRN
  Administered 2021-04-05 – 2021-04-07 (×3): 650 mg via ORAL
  Filled 2021-04-05 (×3): qty 2

## 2021-04-05 MED ORDER — CEFAZOLIN SODIUM-DEXTROSE 1-4 GM/50ML-% IV SOLN
INTRAVENOUS | Status: DC | PRN
  Administered 2021-04-05: 2 g via INTRAVENOUS

## 2021-04-05 MED ORDER — ENSURE MAX PROTEIN PO LIQD
11.0000 [oz_av] | Freq: Two times a day (BID) | ORAL | Status: DC
Start: 2021-04-06 — End: 2021-04-07
  Administered 2021-04-06: 11 [oz_av] via ORAL
  Filled 2021-04-05: qty 330

## 2021-04-05 MED ORDER — HEPARIN SODIUM (PORCINE) 1000 UNIT/ML IJ SOLN
INTRAMUSCULAR | Status: AC
  Filled 2021-04-05: qty 10

## 2021-04-05 MED ORDER — HEPARIN (PORCINE) 25000 UT/250ML-% IV SOLN
2200.0000 [IU]/h | INTRAVENOUS | Status: DC
  Administered 2021-04-05: 1800 [IU]/h via INTRAVENOUS
  Administered 2021-04-06: 2000 [IU]/h via INTRAVENOUS
  Administered 2021-04-07: 2200 [IU]/h via INTRAVENOUS
  Administered 2021-04-07: 2000 [IU]/h via INTRAVENOUS
  Filled 2021-04-05 (×4): qty 250

## 2021-04-05 MED ORDER — OXYCODONE HCL 5 MG PO TABS
5.0000 mg | ORAL_TABLET | Freq: Four times a day (QID) | ORAL | Status: DC | PRN
  Administered 2021-04-05: 5 mg via ORAL
  Filled 2021-04-05 (×2): qty 1

## 2021-04-05 MED ORDER — CEFAZOLIN SODIUM-DEXTROSE 2-4 GM/100ML-% IV SOLN
INTRAVENOUS | Status: AC
  Filled 2021-04-05: qty 100

## 2021-04-05 MED ORDER — ADULT MULTIVITAMIN W/MINERALS CH
1.0000 | ORAL_TABLET | Freq: Every day | ORAL | Status: DC
  Administered 2021-04-06 – 2021-04-08 (×3): 1 via ORAL
  Filled 2021-04-05 (×3): qty 1

## 2021-04-05 MED ORDER — IOHEXOL 350 MG/ML SOLN
100.0000 mL | Freq: Once | INTRAVENOUS | Status: AC | PRN
  Administered 2021-04-05: 100 mL via INTRAVENOUS

## 2021-04-05 MED ORDER — HEPARIN (PORCINE) 25000 UT/250ML-% IV SOLN
1800.0000 [IU]/h | INTRAVENOUS | Status: DC
  Administered 2021-04-05: 1600 [IU]/h via INTRAVENOUS
  Filled 2021-04-05: qty 250

## 2021-04-05 MED ORDER — MIDAZOLAM HCL 2 MG/2ML IJ SOLN
INTRAMUSCULAR | Status: AC
  Filled 2021-04-05: qty 2

## 2021-04-05 MED ORDER — POTASSIUM CHLORIDE CRYS ER 20 MEQ PO TBCR
40.0000 meq | EXTENDED_RELEASE_TABLET | Freq: Once | ORAL | Status: AC
  Administered 2021-04-05: 40 meq via ORAL
  Filled 2021-04-05: qty 2

## 2021-04-05 MED ORDER — ALTEPLASE 2 MG IJ SOLR
INTRAMUSCULAR | Status: AC
  Filled 2021-04-05: qty 10

## 2021-04-05 MED ORDER — MIDAZOLAM HCL 2 MG/2ML IJ SOLN
INTRAMUSCULAR | Status: DC | PRN
  Administered 2021-04-05 (×3): 1 mg via INTRAVENOUS
  Administered 2021-04-05: .5 mg via INTRAVENOUS
  Administered 2021-04-05 (×2): 1 mg via INTRAVENOUS

## 2021-04-05 MED ORDER — HEPARIN BOLUS VIA INFUSION
5000.0000 [IU] | Freq: Once | INTRAVENOUS | Status: AC
  Administered 2021-04-05: 5000 [IU] via INTRAVENOUS
  Filled 2021-04-05: qty 5000

## 2021-04-05 MED ORDER — FENTANYL CITRATE (PF) 100 MCG/2ML IJ SOLN
INTRAMUSCULAR | Status: DC | PRN
  Administered 2021-04-05 (×5): 25 ug via INTRAVENOUS
  Administered 2021-04-05: 50 ug via INTRAVENOUS
  Administered 2021-04-05: 25 ug via INTRAVENOUS

## 2021-04-05 MED ORDER — POTASSIUM CHLORIDE 10 MEQ/100ML IV SOLN
10.0000 meq | INTRAVENOUS | Status: DC
  Administered 2021-04-05: 10 meq via INTRAVENOUS
  Filled 2021-04-05 (×3): qty 100

## 2021-04-05 MED ORDER — HEPARIN SODIUM (PORCINE) 1000 UNIT/ML IJ SOLN
INTRAMUSCULAR | Status: DC | PRN
  Administered 2021-04-05 (×2): 3000 [IU] via INTRAVENOUS

## 2021-04-05 MED ORDER — MIDAZOLAM HCL 5 MG/5ML IJ SOLN
INTRAMUSCULAR | Status: AC
  Filled 2021-04-05: qty 5

## 2021-04-05 SURGICAL SUPPLY — 22 items
CANISTER PENUMBRA ENGINE (MISCELLANEOUS) ×1 IMPLANT
CANNULA 5F STIFF (CANNULA) ×1 IMPLANT
CATH ANGIO 5F PIGTAIL 100CM (CATHETERS) ×1 IMPLANT
CATH BEACON 5 .038 100 VERT TP (CATHETERS) ×1 IMPLANT
CATH INDIGO CAT6 KIT (CATHETERS) ×1 IMPLANT
CATH INFINITI JR4 5F (CATHETERS) ×1 IMPLANT
CATH LIGHTNING 8 XTORQ 115 (CATHETERS) ×2 IMPLANT
CATH SELECT H1 TIP 5F 130 (CATHETERS) ×1 IMPLANT
CATH VTK 5FR 125CM BEACON TIP (CATHETERS) ×1 IMPLANT
DEVICE TORQUE (MISCELLANEOUS) ×1 IMPLANT
GLIDEWIRE ADV .035X260CM (WIRE) ×1 IMPLANT
GLIDEWIRE ANGLED SS 035X260CM (WIRE) ×1 IMPLANT
PACK ANGIOGRAPHY (CUSTOM PROCEDURE TRAY) ×2 IMPLANT
SHEATH BRITE TIP 7FRX11 (SHEATH) ×1 IMPLANT
SHEATH DESTINATION 8FR 90CM (SHEATH) ×1 IMPLANT
SHEATH DESTINIATION 65 8FR (SHEATH) ×1 IMPLANT
SHEATH PINNACLE 11FRX10 (SHEATH) ×1 IMPLANT
SUT PROLENE 0 CT 1 30 (SUTURE) ×1 IMPLANT
SYR MEDRAD MARK 7 150ML (SYRINGE) ×2 IMPLANT
TUBING CONTRAST HIGH PRESS 72 (TUBING) ×2 IMPLANT
WIRE AMPLATZ SSTIFF .035X260CM (WIRE) ×1 IMPLANT
WIRE GUIDERIGHT .035X150 (WIRE) ×1 IMPLANT

## 2021-04-05 NOTE — Progress Notes (Signed)
ANTICOAGULATION CONSULT NOTE ? ?Pharmacy Consult for IV Heparin  ?Indication: pulmonary embolus ? ?Patient Measurements: ?Height: 5\' 9"  (175.3 cm) ?Weight: (!) 169 kg (372 lb 9.2 oz) ?IBW/kg (Calculated) : 70.7 ?Heparin Dosing Weight: 101.3 kg  ? ?Labs: ?Recent Labs  ?  04/04/21 ?1407 04/04/21 ?1925 04/04/21 ?2158 04/05/21 ?0102 04/05/21 ?[id number] 04/05/21 ?[id number] 04/05/21 ?[id number] 04/05/21 ?1224  ?HGB 16.2  --  15.6  --  14.7  --   --   --   ?HCT 48.1  --  46.4  --  43.3  --   --   --   ?PLT 211  --  218  --  189  --   --   --   ?HEPARINUNFRC  --   --   --   --   --   --  0.21*  --   ?CREATININE 0.73  --  0.80   < > 0.65 0.61  --  0.61  ?TROPONINIHS 6 6  --   --   --   --   --   --   ? < > = values in this interval not displayed.  ? ? ? ?Estimated Creatinine Clearance: 225.3 mL/min (by C-G formula based on SCr of 0.61 mg/dL). ? ? ?Medical History: ?History reviewed. No pertinent past medical history. ? ?Assessment: ?Patient is a 21 y/o M with medical history including obesity who presented to the ED 3/15 with pleuritic chest pain. Patient subsequently found to have acute submassive PE and DKA in setting of undiagnosed diabetes. Pharmacy consulted to initiate heparin infusion for acute submassive PE. H&H, platelets remain wnl. Heparin was held today for a thrombectomy/thrombolysis and heparin has now been restarted  ? ?Goal of Therapy:  ?Heparin level 0.3-0.7 units/ml ?Monitor platelets by anticoagulation protocol: Yes ?  ?Plan:  ?Heparin infusion has been restarted at 1800 units/hr ?Check heparin level 6 hours after restarting ?Daily CBC per protocol while on IV heparin ?Follow-up transition to [facility] when appropriate / at conclusion of procedures ? ?Dallie Piles ?04/05/2021,3:56 PM ? ? ?

## 2021-04-05 NOTE — Progress Notes (Signed)
ANTICOAGULATION CONSULT NOTE ? ?Pharmacy Consult for IV Heparin  ?Indication: pulmonary embolus ? ?Patient Measurements: ?Height: 5\' 9"  (175.3 cm) ?Weight: (!) 169 kg (372 lb 9.2 oz) ?IBW/kg (Calculated) : 70.7 ?Heparin Dosing Weight: 101.3 kg  ? ?Labs: ?Recent Labs  ?  04/04/21 ?1407 04/04/21 ?1925 04/04/21 ?2158 04/05/21 ?0102 04/05/21 ?04/07/21 04/05/21 ?04/07/21 04/05/21 ?04/07/21  ?HGB 16.2  --  15.6  --  14.7  --   --   ?HCT 48.1  --  46.4  --  43.3  --   --   ?PLT 211  --  218  --  189  --   --   ?HEPARINUNFRC  --   --   --   --   --   --  0.21*  ?CREATININE 0.73  --  0.80 0.73 0.65 0.61  --   ?TROPONINIHS 6 6  --   --   --   --   --   ? ? ? ?Estimated Creatinine Clearance: 225.3 mL/min (by C-G formula based on SCr of 0.61 mg/dL). ? ? ?Medical History: ?History reviewed. No pertinent past medical history. ? ?Assessment: ?Patient is a 21 y/o M with medical history including obesity who presented to the ED 3/15 with pleuritic chest pain. Patient subsequently found to have acute submassive PE and DKA in setting of undiagnosed diabetes. Pharmacy consulted to initiate heparin infusion for acute submassive PE.  ? ?Goal of Therapy:  ?Heparin level 0.3-0.7 units/ml ?Monitor platelets by anticoagulation protocol: Yes ?  ?Plan:  ?--Heparin level is subtherapeutic ?--Heparin 1500 unit IV bolus and increase heparin infusion rate to 1800 units/hr ?--Repeat HL 6 hours after rate change ?--Daily CBC per protocol while on IV heparin ?--Follow-up transition to [facility] when appropriate / at conclusion of procedures ? ?[facility] ?04/05/2021,10:33 AM ? ? ?

## 2021-04-05 NOTE — Progress Notes (Addendum)
Inpatient Diabetes Program Recommendations ? ?AACE/ADA: New Consensus Statement on Inpatient Glycemic Control (2015) ? ?Target Ranges:  Prepandial:   less than 140 mg/dL ?     Peak postprandial:   less than 180 mg/dL (1-2 hours) ?     Critically ill patients:  140 - 180 mg/dL  ? ?Lab Results  ?Component Value Date  ? GLUCAP 216 (H) 04/05/2021  ? ? ?Review of Glycemic Control ? Latest Reference Range & Units 04/05/21 01:56 04/05/21 03:06 04/05/21 04:03 04/05/21 05:00 04/05/21 05:57 04/05/21 07:07 04/05/21 08:19  ?Glucose-Capillary 70 - 99 mg/dL 854 (H) 627 (H) 035 (H) 197 (H) 176 (H) 190 (H) 216 (H)  ? ?Diabetes history: None- New Diagnosis ? ?Current orders for Inpatient glycemic control:  ?IV insulin/ DKA order set ? ?Inpatient Diabetes Program Recommendations:   ? ?Note new diagnosis this admission. Patient for procedure/surgery this morning.  Will follow-up this afternoon or tomorrow morning regarding DM management and new/survival skill education.   ? ?Thanks,  ?Beryl Meager, RN, BC-ADM ?Inpatient Diabetes Coordinator ?Pager [phone]  (8a-5p) ? ?Addendum 1315:  Patient in procedure and plan is to transition off insulin drip after procedure this afternoon. Last insulin infusion rate was 9.5 units/ hr,.  Consider transition to Levemir 30 units bid and Novolog resistant q 4 hours. Will see patient tomorrow.  ? ? ?

## 2021-04-05 NOTE — Progress Notes (Signed)
Postop check  ? ?S: Patient lying in bed upon my entering his room.  O2 sats are 97% on room air he denies pain in the groin.  States he is doing just fine with his bedrest ? ?O: Heart rate equals 122; blood pressure is 152/91; O2 sats of 97% on room air ?      Groin is soft ? ?A: Massive right-sided pulmonary embolism status post pulmonary thrombectomy ? ?P: The patient had a successful recanalization of the right middle lobe.  Unfortunately I was not able to reopen the right upper or lower lobes.  This also means that the massive pulmonary embolism in the right main pulmonary artery seems to have been largely removed.  I discussed this with the patient and family that were in attendance.  I also discussed the specimen that I photographed and placed in the chart was also sent to pathology so that we could see if as I suspect this is a chronic or at the very least subacute condition.  I also discussed with the patient and family members that if the pathology comes back as chronic and organized thrombus the best neck step will be to involve pulmonology to see what kind of optimization we can achieve for the future.  Where the specimen to come back acute thrombus and perhaps a second attempt at intervention would be feasible but I believe that this is unlikely given my examination of the fragments of thrombus that we removed.  I will await the final pathology report before any further decisions ?

## 2021-04-05 NOTE — TOC Initial Note (Signed)
Transition of Care (TOC) - Initial/Assessment Note  ? ? ?Patient Details  ?Name: Tony Malone ?MRN: [id number] ?Date of Birth: 12-May-2000 ? ?Transition of Care (TOC) CM/SW Contact:    ?Shelbie Hutching, RN ?Phone Number: ?04/05/2021, 4:02 PM ? ?Clinical Narrative:                 ?Patient admitted to the hospital with DKA and PE, had thrombectomy today with vascular surgery.  RNCM met with patient and his girlfriend and aunt at the bedside in the ICU.  RNCM introduced self and explained role.  Patient is from home where he lives with his girlfriend and her mother.  He is independent and works at [employer].  He does not have insurance he reports he missed the open enrollment so has to wait for it to come around again.   ?Patient also needs a PCP at discharge.  RNCM provided patient with [facility] and Medication management application, informed him that we can get his prescriptions from Medication Management at discharge for free.  RNCM also provided patient with information on other sliding scale clinics in the area.   ?Aunt was inquiring about [facility].  RNCM reached out to Financial Counselors to follow up with patient on Financial assistance.   ? ?TOC will cont to follow.   ? ?Expected Discharge Plan: Home/Self Care ?Barriers to Discharge: Continued Medical Work up ? ? ?Patient Goals and CMS Choice ?Patient states their goals for this hospitalization and ongoing recovery are:: will need financial assistance ?  ?  ? ?Expected Discharge Plan and Services ?Expected Discharge Plan: Home/Self Care ?  ?Discharge Planning Services: CM Consult, [facility], Medication Assistance ?  ?Living arrangements for the past 2 months: [place] ?                ?DME Arranged: N/A ?DME Agency: NA ?  ?  ?  ?HH Arranged: NA ?[place] Agency: NA ?  ?  ?  ? ?Prior Living Arrangements/Services ?Living arrangements for the past 2 months: [place] ?Lives with:: Significant Other, Other  (Comment) ?Patient language and need for interpreter reviewed:: Yes ?Do you feel safe going back to the place where you live?: Yes      ?Need for Family Participation in Patient Care: Yes (Comment) ?Care giver support system in place?: Yes (comment) (aunt and girlfriend) ?  ?Criminal Activity/Legal Involvement Pertinent to Current Situation/Hospitalization: No - Comment as needed ? ?Activities of Daily Living ?Home Assistive Devices/Equipment: None ?ADL Screening (condition at time of admission) ?Patient's cognitive ability adequate to safely complete daily activities?: Yes ?Is the patient deaf or have difficulty hearing?: No ?Does the patient have difficulty seeing, even when wearing glasses/contacts?: No ?Does the patient have difficulty concentrating, remembering, or making decisions?: No ?Patient able to express need for assistance with ADLs?: No ?Does the patient have difficulty dressing or bathing?: No ?Independently performs ADLs?: Yes (appropriate for developmental age) ?Does the patient have difficulty walking or climbing stairs?: No ?Weakness of Legs: None ?Weakness of Arms/Hands: None ? ?Permission Sought/Granted ?Permission sought to share information with : Family Supports ?Permission granted to share information with : Yes, Verbal Permission Granted ? Share Information with NAME: Vassie Moselle ?   ? Permission granted to share info w Relationship: aunt ? Permission granted to share info w Contact Information: [phone] ? ?Emotional Assessment ?Appearance:: Appears stated age ?Attitude/Demeanor/Rapport: Engaged ?Affect (typically observed): Accepting ?Orientation: : Oriented to Self, Oriented to Place,  Oriented to  Time, Oriented to Situation ?Alcohol / Substance Use: Not Applicable ?Psych Involvement: No (comment) ? ?Admission diagnosis:  Polyuria [R35.89] ?DKA (diabetic ketoacidosis) ([place]) [E11.10] ?Hyperglycemia [R73.9] ?Diabetic ketoacidosis without coma associated with type 2 diabetes  mellitus ([place]) [E11.10] ?Patient Active Problem List  ? Diagnosis Date Noted  ? Acute pulmonary embolism ([place]) 04/05/2021  ? Hypokalemia 04/05/2021  ? DKA (diabetic ketoacidosis) ([place]) 04/04/2021  ? Leg pain 04/04/2021  ? Leg swelling 04/04/2021  ? Polyuria 04/04/2021  ? Polydipsia 04/04/2021  ? SIRS (systemic inflammatory response syndrome) ([place]) 04/04/2021  ? SOB (shortness of breath) 04/04/2021  ? Hyponatremia 04/04/2021  ? Leucocytosis 04/04/2021  ? Chest pain 04/04/2021  ? Morbid obesity with BMI of 50.0-59.9, adult ([place]) 04/04/2021  ? ?PCP:  Patient, No Pcp Per (Inactive) ?Pharmacy:   ?[id number] - [place], [place] - [address] ?[address] ?[id number]?Phone: [phone]: [phone]? ? ? ? ?Social Determinants of Health (SDOH) Interventions ?  ? ?Readmission Risk Interventions ?No flowsheet data found. ? ? ?

## 2021-04-05 NOTE — Assessment & Plan Note (Addendum)
Secondary to DKA.  Last sodium 133 ?

## 2021-04-05 NOTE — H&P (View-Only) (Signed)
@LOGO @ ? ? ?MRN : ? ?Tony Malone is a 21 y.o. (15-May-2000) male who presents with chief complaint of clot in my lung. ? ?History of Present Illness:  ? ?I am asked to evaluate the patient by Dr. 11/06/1998. ? ?Patient is a 21 year old gentleman who presented to the hospital earlier this morning with the complaint of chest pain and profound shortness of breath.  Work-up in the emergency room also indicated diabetic ketoacidosis.  Patient was noted to be short of breath just with speech.  Because of his shortness of breath and chest pain CT angiogram was done which demonstrated massive pulmonary embolism in the right main pulmonary artery in association with right heart strain.  Patient has been initiated on a heparin drip and has been transferred to the intensive care unit. ? ?There is no past history of DVT or PE.  No recent periods of immobility.  No family history of a hypercoagulable syndrome. ? ?No outpatient medications have been marked as taking for the 04/04/21 encounter [facility] Encounter).  ? ? ?History reviewed. No pertinent past medical history. ? ?History reviewed. No pertinent surgical history. ? ?Social History ?Social History  ? ?Tobacco Use  ? Smoking status: Never  ?Substance Use Topics  ? Drug use: No  ? ? ?Family History ?History reviewed. No pertinent family history. ? ?No Known Allergies ? ? ?REVIEW OF SYSTEMS (Negative unless checked) ? ?Constitutional: [] Weight loss  [] Fever  [] Chills ?Cardiac: [] Chest pain   [] Chest pressure   [] Palpitations   [x] Shortness of breath with exertion. ?Vascular:  [] Pain in legs with walking   [] Pain in legs at rest  [] History of DVT   [] Phlebitis   [x] Swelling in legs   [] Varicose veins   [] Non-healing ulcers ?Pulmonary:   [] Uses home oxygen   [] Productive cough   [] Hemoptysis   [] Wheeze  [] COPD   [] Asthma ?Neurologic:  [] Dizziness   [] Seizures   [] History of stroke   [] History of TIA  [] Aphasia   [] Vissual changes   [] Weakness or numbness in arm    [] Weakness or numbness in leg ?Musculoskeletal:   [] Joint swelling   [] Joint pain   [] Low back pain ?Hematologic:  [] Easy bruising  [] Easy bleeding   [] Hypercoagulable state   [] Anemic ?Gastrointestinal:  [] Diarrhea   [] Vomiting  [] Gastroesophageal reflux/heartburn   [] Difficulty swallowing. ?Genitourinary:  [] Chronic kidney disease   [] Difficult urination  [] Frequent urination   [] Blood in urine ?Skin:  [] Rashes   [] Ulcers  ?Psychological:  [] History of anxiety   []  History of major depression. ? ?Physical Examination ? ?Vitals:  ? 04/05/21 1000 04/05/21 1100 04/05/21 1200 04/05/21 1257  ?BP: (!) 144/96 137/84 (!) 146/101 (!) 154/89  ?Pulse: (!) 107 (!) 105 (!) 108 (!) 116  ?Resp: (!) 22 (!) 28 18 18   ?Temp:    98.3 ?F (36.8 ?C)  ?TempSrc:    Oral  ?SpO2: 96% 97% 94% 97%  ?Weight:      ?Height:      ? ?Body mass index is 55.02 kg/m?. ?Gen: WD/WN, morbidly obese gentleman lying in his hospital bed in moderate to severe distress ?Head: [place]/AT, No temporalis wasting.  ?Ear/Nose/Throat: Hearing grossly intact, nares w/o erythema or drainage, pinna without lesions ?Eyes: PER, EOMI, sclera nonicteric.  ?Neck: Supple, no gross masses.  No JVD.  ?Pulmonary:  Good air movement, no audible wheezing, no use of accessory muscles.  ?Cardiac: RRR, precordium not hyperdynamic. ?Vascular:  scattered varicosities present bilaterally.  Mild venous stasis changes to the legs bilaterally.  2+ soft pitting edema  ?Vessel Right Left  ?Radial Palpable Palpable  ?Gastrointestinal: soft, non-distended. No guarding/no peritoneal signs.  ?Musculoskeletal: M/S 5/5 throughout.  No deformity.  ?Neurologic: CN 2-12 intact. Pain and light touch intact in extremities.  Symmetrical.  Speech is fluent. Motor exam as listed above. ?Psychiatric: Judgment intact, Mood & affect appropriate for pt's clinical situation. ?Dermatologic: Venous rashes no ulcers noted.  No changes consistent with cellulitis. ?Lymph : No lichenification or skin changes of  chronic lymphedema. ? ?CBC ?Lab Results  ?Component Value Date  ? WBC 10.6 (H) 04/05/2021  ? HGB 14.7 04/05/2021  ? HCT 43.3 04/05/2021  ? MCV 87.1 04/05/2021  ? PLT 189 04/05/2021  ? ? ?BMET ?   ?Component Value Date/Time  ? NA 135 04/05/2021 1224  ? NA 138 06/09/2013 1006  ? K 3.5 04/05/2021 1224  ? K 3.9 06/09/2013 1006  ? CL 103 04/05/2021 1224  ? CL 105 06/09/2013 1006  ? CO2 19 (L) 04/05/2021 1224  ? CO2 28 (H) 06/09/2013 1006  ? GLUCOSE 192 (H) 04/05/2021 1224  ? GLUCOSE 108 (H) 06/09/2013 1006  ? BUN 5 (L) 04/05/2021 1224  ? BUN 10 06/09/2013 1006  ? CREATININE 0.61 04/05/2021 1224  ? CREATININE 0.68 06/09/2013 1006  ? CALCIUM 9.0 04/05/2021 1224  ? CALCIUM 9.4 06/09/2013 1006  ? GFRNONAA >60 04/05/2021 1224  ? GFRAA >60 07/02/2019 1029  ? ?Estimated Creatinine Clearance: 225.3 mL/min (by C-G formula based on SCr of 0.61 mg/dL). ? ?COAG ?No results found for: INR, PROTIME ? ?Radiology ?DG Chest 2 View ? ?Result Date: 04/04/2021 ?CLINICAL DATA:  Shortness of breath and chest pain. EXAM: CHEST - 2 VIEW COMPARISON:  07/02/2019 FINDINGS: Cardiac silhouette and mediastinal contours are within normal limits. The lungs are clear. No pleural effusion or pneumothorax. No acute skeletal abnormality. IMPRESSION: No active cardiopulmonary disease. Electronically Signed   By: Ronald  Viola M.D.   On: 04/04/2021 14:45  ? ?CT Angio Chest Pulmonary Embolism (PE) W or WO Contrast ? ?Result Date: 04/05/2021 ?CLINICAL DATA:  Concern for pulmonary embolism.  Positive D-dimer. EXAM: CT ANGIOGRAPHY CHEST WITH CONTRAST TECHNIQUE: Multidetector CT imaging of the chest was performed using the standard protocol during bolus administration of intravenous contrast. Multiplanar CT image reconstructions and MIPs were obtained to evaluate the vascular anatomy. RADIATION DOSE REDUCTION: This exam was performed according to the departmental dose-optimization program which includes automated exposure control, adjustment of the mA and/or kV  according to patient size and/or use of iterative reconstruction technique. CONTRAST:  100mL OMNIPAQUE IOHEXOL 350 MG/ML SOLN COMPARISON:  Chest radiograph dated 04/04/2021. FINDINGS: Evaluation of this exam is limited due to respiratory motion artifact as well as streak artifact caused by patient's arms. Evaluation is also limited due to suboptimal opacification of the pulmonary artery branches and timing of the contrast. Cardiovascular: There is no cardiomegaly or pericardial effusion. The thoracic aorta is unremarkable. The origins of the great vessels of the aortic arch appear patent as visualized. There is a large pulmonary artery embolus involving the right main pulmonary artery extending into the upper and lower lobe lobar branches. Evaluation is limited due to factors above but the clot appears nearly occlusive. There is mild dilatation of the right ventricle with RV/LV ratio of approximately 1.2 consistent with right heart straining. Mediastinum/Nodes: No hilar or mediastinal adenopathy. The esophagus is grossly unremarkable. No mediastinal fluid collection. Lungs/Pleura: Faint right lung base subpleural ground-glass densities may represent atelectasis or developing infarct. No focal consolidation,   pleural effusion, or pneumothorax. The central airways are patent. Upper Abdomen: Fatty liver. Musculoskeletal: No chest wall abnormality. No acute or significant osseous findings. Review of the MIP images confirms the above findings. IMPRESSION: 1. Positive for acute PE with CT evidence of right heart straining (RV/LV Ratio = 1.2) consistent with at least submassive (intermediate risk) PE. The presence of right heart strain has been associated with an increased risk of morbidity and mortality. 2. Fatty liver. These results were called by telephone at the time of interpretation on 04/05/2021 at 2:25 am to nurse practitioner, Jon Billings, who verbally acknowledged these results. Electronically Signed   By: Elgie Collard M.D.   On: 04/05/2021 02:43   ? ? ?Assessment/Plan ?1.  Massive right-sided pulmonary embolism with right heart strain: ?Patient has been initiated on a heparin drip.  His hemodynamics have stabilized but

## 2021-04-05 NOTE — Assessment & Plan Note (Addendum)
The patient was brought to the vascular lab on 04/05/2021 for massive right-sided pulmonary embolism.  The patient had pulmonary thrombectomy by Dr. Gilda Crease.  The embolism in the right main pulmonary artery is largely removed but the patient did have residual clot in the right upper and lower lobes.  Clot was sent off for pathology to see if this is chronic thrombus or not. Echocardiogram was a difficult study but did show likely clot in the right ventricle.  Ultrasound the lower extremities negative.  Case discussed with Dr. Evie Lacks vascular surgery covering for the weekend and was okay with switching over to Xarelto for 04/07/2021.  The patient did well with Xarelto and will discharge home on 04/08/2021. ?

## 2021-04-05 NOTE — Progress Notes (Signed)
Cross Cover ?Paged from [facility] radiology with results CTA chest + right PE with right heart strain ?VSS, BP controlled, sinus tach on monitor 120. A patient endorses shortness of breath but sats stable on room air and no significant distress noted at bedside. He has been started on heparin drip. Consult vascular this am for eval for thrombectomy ? ?

## 2021-04-05 NOTE — Progress Notes (Signed)
?  Progress Note ? ? ?Patient: Tony Malone [id number] DOB: [birth year] DOA: 04/04/2021     1 ?DOS: the patient was seen and examined on 04/05/2021 ?  ? ? ?Assessment and Plan: ?Acute pulmonary embolism ([place]) ?CT angio of the chest showed a large right-sided pulmonary embolism with right heart strain.  Patient was started on heparin drip after this diagnosis and was feeling better in the morning.  Case discussed with vascular surgery Dr. Delana Meyer to take to the procedure room today for thrombectomy and thrombolysis.  Echocardiogram ordered.  We will also order a ultrasound of the lower extremity prior to disposition. ? ?DKA (diabetic ketoacidosis) ([place]) ?New onset diabetes mellitus.  Patient admitted with mild diabetic ketoacidosis.  Continue insulin drip until after procedure and then will switch over to long-acting insulin and sliding scale after that.  Continue IV fluids and potassium replacement. ? ?Hyponatremia ?Secondary to DKA.  Last sodium 132. ? ?Hypokalemia ?Likely secondary to insulin drip.  Replacing IV while NPO. ? ?Morbid obesity with BMI of 50.0-59.9, adult ([place]) ?BMI recorded at 55.02 ? ? ? ? ?  ? ?Subjective: Patient came in with chest pain and shortness of breath and was found to be in diabetic ketoacidosis.  CAT angiogram of the chest showed a large right-sided pulmonary embolism with right heart strain ? ?Physical Exam: ?Vitals:  ? 04/05/21 0500 04/05/21 0600 04/05/21 0700 04/05/21 0800  ?BP: (!) 142/110 (!) 149/106 (!) 151/101 111/87  ?Pulse: (!) 107 (!) 116 (!) 107 (!) 108  ?Resp: 20 15 (!) 29 17  ?Temp:   97.6 ?F (36.4 ?C)   ?TempSrc:   Axillary   ?SpO2: 94% 95% 97% 98%  ?Weight:      ?Height:      ? ?Physical Exam ?HENT:  ?   Head: Normocephalic.  ?   Mouth/Throat:  ?   Pharynx: No oropharyngeal exudate.  ?Eyes:  ?   General: Lids are normal.  ?   Conjunctiva/sclera: Conjunctivae normal.  ?Cardiovascular:  ?   Rate and Rhythm: Regular rhythm. Tachycardia present.  ?   Heart sounds: Normal  heart sounds, S1 normal and S2 normal.  ?Pulmonary:  ?   Breath sounds: Examination of the right-lower field reveals decreased breath sounds. Examination of the left-lower field reveals decreased breath sounds. Decreased breath sounds present. No wheezing, rhonchi or rales.  ?Abdominal:  ?   Palpations: Abdomen is soft.  ?   Tenderness: There is no abdominal tenderness.  ?Musculoskeletal:  ?   Right lower leg: Swelling present.  ?   Left lower leg: Swelling present.  ?Skin: ?   General: Skin is warm.  ?   Findings: No rash.  ?Neurological:  ?   Mental Status: He is alert and oriented to person, place, and time.  ?  ?Data Reviewed: ?CT scan of the chest reviewed showing large right-sided pulmonary embolism.  This was also discussed with vascular surgery.  Laboratory and radiological data reviewed from the hospital course.  Sodium 132, potassium 3.3.  Last CO2 18. ? ?Family Communication: Patient declined ? ?Disposition: ?Status is: Inpatient ?Remains inpatient appropriate because: Patient will go to the vascular lab because of large right-sided pulmonary embolism with right heart strain. ? ?Planned Discharge Destination: Home ? ?Author: ?Loletha Grayer, MD ?04/05/2021 11:45 AM ? ?For on call review [email or web address].  ?

## 2021-04-05 NOTE — Assessment & Plan Note (Addendum)
Replaced into the normal range. 

## 2021-04-05 NOTE — Consult Note (Signed)
@LOGO @ ? ? ?MRN : ? ?Tony Malone is a 21 y.o. (15-May-2000) male who presents with chief complaint of clot in my lung. ? ?History of Present Illness:  ? ?I am asked to evaluate the patient by Dr. 11/06/1998. ? ?Patient is a 21 year old gentleman who presented to the hospital earlier this morning with the complaint of chest pain and profound shortness of breath.  Work-up in the emergency room also indicated diabetic ketoacidosis.  Patient was noted to be short of breath just with speech.  Because of his shortness of breath and chest pain CT angiogram was done which demonstrated massive pulmonary embolism in the right main pulmonary artery in association with right heart strain.  Patient has been initiated on a heparin drip and has been transferred to the intensive care unit. ? ?There is no past history of DVT or PE.  No recent periods of immobility.  No family history of a hypercoagulable syndrome. ? ?No outpatient medications have been marked as taking for the 04/04/21 encounter [facility] Encounter).  ? ? ?History reviewed. No pertinent past medical history. ? ?History reviewed. No pertinent surgical history. ? ?Social History ?Social History  ? ?Tobacco Use  ? Smoking status: Never  ?Substance Use Topics  ? Drug use: No  ? ? ?Family History ?History reviewed. No pertinent family history. ? ?No Known Allergies ? ? ?REVIEW OF SYSTEMS (Negative unless checked) ? ?Constitutional: [] Weight loss  [] Fever  [] Chills ?Cardiac: [] Chest pain   [] Chest pressure   [] Palpitations   [x] Shortness of breath with exertion. ?Vascular:  [] Pain in legs with walking   [] Pain in legs at rest  [] History of DVT   [] Phlebitis   [x] Swelling in legs   [] Varicose veins   [] Non-healing ulcers ?Pulmonary:   [] Uses home oxygen   [] Productive cough   [] Hemoptysis   [] Wheeze  [] COPD   [] Asthma ?Neurologic:  [] Dizziness   [] Seizures   [] History of stroke   [] History of TIA  [] Aphasia   [] Vissual changes   [] Weakness or numbness in arm    [] Weakness or numbness in leg ?Musculoskeletal:   [] Joint swelling   [] Joint pain   [] Low back pain ?Hematologic:  [] Easy bruising  [] Easy bleeding   [] Hypercoagulable state   [] Anemic ?Gastrointestinal:  [] Diarrhea   [] Vomiting  [] Gastroesophageal reflux/heartburn   [] Difficulty swallowing. ?Genitourinary:  [] Chronic kidney disease   [] Difficult urination  [] Frequent urination   [] Blood in urine ?Skin:  [] Rashes   [] Ulcers  ?Psychological:  [] History of anxiety   []  History of major depression. ? ?Physical Examination ? ?Vitals:  ? 04/05/21 1000 04/05/21 1100 04/05/21 1200 04/05/21 1257  ?BP: (!) 144/96 137/84 (!) 146/101 (!) 154/89  ?Pulse: (!) 107 (!) 105 (!) 108 (!) 116  ?Resp: (!) 22 (!) 28 18 18   ?Temp:    98.3 ?F (36.8 ?C)  ?TempSrc:    Oral  ?SpO2: 96% 97% 94% 97%  ?Weight:      ?Height:      ? ?Body mass index is 55.02 kg/m?. ?Gen: WD/WN, morbidly obese gentleman lying in his hospital bed in moderate to severe distress ?Head: [place]/AT, No temporalis wasting.  ?Ear/Nose/Throat: Hearing grossly intact, nares w/o erythema or drainage, pinna without lesions ?Eyes: PER, EOMI, sclera nonicteric.  ?Neck: Supple, no gross masses.  No JVD.  ?Pulmonary:  Good air movement, no audible wheezing, no use of accessory muscles.  ?Cardiac: RRR, precordium not hyperdynamic. ?Vascular:  scattered varicosities present bilaterally.  Mild venous stasis changes to the legs bilaterally.  2+ soft pitting edema  ?Vessel Right Left  ?Radial Palpable Palpable  ?Gastrointestinal: soft, non-distended. No guarding/no peritoneal signs.  ?Musculoskeletal: M/S 5/5 throughout.  No deformity.  ?Neurologic: CN 2-12 intact. Pain and light touch intact in extremities.  Symmetrical.  Speech is fluent. Motor exam as listed above. ?Psychiatric: Judgment intact, Mood & affect appropriate for pt's clinical situation. ?Dermatologic: Venous rashes no ulcers noted.  No changes consistent with cellulitis. ?Lymph : No lichenification or skin changes of  chronic lymphedema. ? ?CBC ?Lab Results  ?Component Value Date  ? WBC 10.6 (H) 04/05/2021  ? HGB 14.7 04/05/2021  ? HCT 43.3 04/05/2021  ? MCV 87.1 04/05/2021  ? PLT 189 04/05/2021  ? ? ?BMET ?   ?Component Value Date/Time  ? NA 135 04/05/2021 1224  ? NA 138 06/09/2013 1006  ? K 3.5 04/05/2021 1224  ? K 3.9 06/09/2013 1006  ? CL 103 04/05/2021 1224  ? CL 105 06/09/2013 1006  ? CO2 19 (L) 04/05/2021 1224  ? CO2 28 (H) 06/09/2013 1006  ? GLUCOSE 192 (H) 04/05/2021 1224  ? GLUCOSE 108 (H) 06/09/2013 1006  ? BUN 5 (L) 04/05/2021 1224  ? BUN 10 06/09/2013 1006  ? CREATININE 0.61 04/05/2021 1224  ? CREATININE 0.68 06/09/2013 1006  ? CALCIUM 9.0 04/05/2021 1224  ? CALCIUM 9.4 06/09/2013 1006  ? GFRNONAA >60 04/05/2021 1224  ? GFRAA >60 07/02/2019 1029  ? ?Estimated Creatinine Clearance: 225.3 mL/min (by C-G formula based on SCr of 0.61 mg/dL). ? ?COAG ?No results found for: INR, PROTIME ? ?Radiology ?DG Chest 2 View ? ?Result Date: 04/04/2021 ?CLINICAL DATA:  Shortness of breath and chest pain. EXAM: CHEST - 2 VIEW COMPARISON:  07/02/2019 FINDINGS: Cardiac silhouette and mediastinal contours are within normal limits. The lungs are clear. No pleural effusion or pneumothorax. No acute skeletal abnormality. IMPRESSION: No active cardiopulmonary disease. Electronically Signed   By: Neita Garnetonald  Viola M.D.   On: 04/04/2021 14:45  ? ?CT Angio Chest Pulmonary Embolism (PE) W or WO Contrast ? ?Result Date: 04/05/2021 ?CLINICAL DATA:  Concern for pulmonary embolism.  Positive D-dimer. EXAM: CT ANGIOGRAPHY CHEST WITH CONTRAST TECHNIQUE: Multidetector CT imaging of the chest was performed using the standard protocol during bolus administration of intravenous contrast. Multiplanar CT image reconstructions and MIPs were obtained to evaluate the vascular anatomy. RADIATION DOSE REDUCTION: This exam was performed according to the departmental dose-optimization program which includes automated exposure control, adjustment of the mA and/or kV  according to patient size and/or use of iterative reconstruction technique. CONTRAST:  100mL OMNIPAQUE IOHEXOL 350 MG/ML SOLN COMPARISON:  Chest radiograph dated 04/04/2021. FINDINGS: Evaluation of this exam is limited due to respiratory motion artifact as well as streak artifact caused by patient's arms. Evaluation is also limited due to suboptimal opacification of the pulmonary artery branches and timing of the contrast. Cardiovascular: There is no cardiomegaly or pericardial effusion. The thoracic aorta is unremarkable. The origins of the great vessels of the aortic arch appear patent as visualized. There is a large pulmonary artery embolus involving the right main pulmonary artery extending into the upper and lower lobe lobar branches. Evaluation is limited due to factors above but the clot appears nearly occlusive. There is mild dilatation of the right ventricle with RV/LV ratio of approximately 1.2 consistent with right heart straining. Mediastinum/Nodes: No hilar or mediastinal adenopathy. The esophagus is grossly unremarkable. No mediastinal fluid collection. Lungs/Pleura: Faint right lung base subpleural ground-glass densities may represent atelectasis or developing infarct. No focal consolidation,  pleural effusion, or pneumothorax. The central airways are patent. Upper Abdomen: Fatty liver. Musculoskeletal: No chest wall abnormality. No acute or significant osseous findings. Review of the MIP images confirms the above findings. IMPRESSION: 1. Positive for acute PE with CT evidence of right heart straining (RV/LV Ratio = 1.2) consistent with at least submassive (intermediate risk) PE. The presence of right heart strain has been associated with an increased risk of morbidity and mortality. 2. Fatty liver. These results were called by telephone at the time of interpretation on 04/05/2021 at 2:25 am to nurse practitioner, Jon Billings, who verbally acknowledged these results. Electronically Signed   By: Elgie Collard M.D.   On: 04/05/2021 02:43   ? ? ?Assessment/Plan ?1.  Massive right-sided pulmonary embolism with right heart strain: ?Patient has been initiated on a heparin drip.  His hemodynamics have stabilized but

## 2021-04-05 NOTE — Progress Notes (Signed)
ANTICOAGULATION CONSULT NOTE - Initial Consult ? ?Pharmacy Consult for Heparin  ?Indication: pulmonary embolus ? ?No Known Allergies ? ?Patient Measurements: ?Height: 5\' 9"  (175.3 cm) ?Weight: (!) 169 kg (372 lb 9.2 oz) ?IBW/kg (Calculated) : 70.7 ?Heparin Dosing Weight: 101.3 kg  ? ?Vital Signs: ?Temp: 97.6 ?F (36.4 ?C) (03/15 2145) ?Temp Source: Oral (03/15 2145) ?BP: 139/102 (03/16 0100) ?Pulse Rate: 119 (03/16 0100) ? ?Labs: ?Recent Labs  ?  04/04/21 ?1407 04/04/21 ?1925 04/04/21 ?2158 04/05/21 ?0102  ?HGB 16.2  --  15.6  --   ?HCT 48.1  --  46.4  --   ?PLT 211  --  218  --   ?CREATININE 0.73  --  0.80 0.73  ?TROPONINIHS 6 6  --   --   ? ? ?Estimated Creatinine Clearance: 225.3 mL/min (by C-G formula based on SCr of 0.73 mg/dL). ? ? ?Medical History: ?History reviewed. No pertinent past medical history. ? ?Medications:  ?Medications Prior to Admission  ?Medication Sig Dispense Refill Last Dose  ? albuterol (VENTOLIN HFA) 108 (90 Base) MCG/ACT inhaler Inhale 2 puffs into the lungs every 6 (six) hours as needed for wheezing or shortness of breath. (Patient not taking: Reported on 04/04/2021) 6.7 g 0 Not Taking  ? predniSONE (DELTASONE) 50 MG tablet Take 1 tablet (50 mg total) by mouth daily with breakfast. (Patient not taking: Reported on 04/04/2021) 5 tablet 0 Not Taking  ? ? ?Assessment: ?Pharmacy consulted to dose heparin in this 21 year old male admitted with polyuria, newly diagnosed PE.  No prior anticoag noted.  ?CrCl = 225.3 ml/min  ? ?Goal of Therapy:  ?Heparin level 0.3-0.7 units/ml ?Monitor platelets by anticoagulation protocol: Yes ?  ?Plan:  ?Give 5000 units bolus x 1 ?Start heparin infusion at 1600 units/hr ?Check anti-Xa level in 6 hours and daily while on heparin ?Continue to monitor H&H and platelets ? ?Brailee Riede D ?04/05/2021,2:49 AM ? ? ?

## 2021-04-05 NOTE — Progress Notes (Signed)
?  04/05/21 1300  ?Clinical Encounter Type  ?Visited With Patient  ?Visit Type Initial  ?Referral From Nurse  ?Consult/Referral To Chaplain  ? ?Chaplain met with patient. He was anxious about procedure and Chaplain provided space for him to discuss his fears and concerns. Chaplain also left AD paperwork as requested. Patient appreciated [place] visit. ?

## 2021-04-05 NOTE — Progress Notes (Signed)
Initial Nutrition Assessment ? ?DOCUMENTATION CODES:  ? ?Morbid obesity ? ?INTERVENTION:  ? ?Ensure Max protein supplement BID, each supplement provides 150kcal and 30g of protein. ? ?MVI po daily  ? ?Diabetic diet education ? ?Pt at high refeed risk; recommend monitor potassium, magnesium and phosphorus labs daily until stable ? ?NUTRITION DIAGNOSIS:  ? ?Inadequate oral intake related to acute illness as evidenced by NPO status. ? ?GOAL:  ? ?Patient will meet greater than or equal to 90% of their needs ? ?MONITOR:  ? ?Labs, PO intake, Supplement acceptance, Weight trends, Skin, I & O's ? ?REASON FOR ASSESSMENT:  ? ?Consult ?Assessment of nutrition requirement/status ? ?ASSESSMENT:  ? ?20 y.o. male with h/o morbid obesity who is admitted with ketoacidosis, possible new DM and PE. ? ?Met with pt in room today. Pt reports good appetite and oral intake pta. Pt currently NPO for thrombectomy today. RD discussed with pt the importance of adequate nutrition needed to preserve lean muscle. Pt is willing to drink protein drinks once his diet is advanced. There is no recent documented weight history in chart to determine if any significant recent weight changes. Pt reports his weight is stable. RD will add supplements and MVI for once pt's diet is advanced. RD provided pt with diabetes diet education today. Hemoglobin A1C not available yet.  ? ?RD provided "Nutrition and Type II Diabetes" handout from the Academy of Nutrition and Dietetics. Discussed different food groups and their effects on blood sugar, emphasizing carbohydrate-containing foods. Provided list of carbohydrates and recommended serving sizes of common foods. ? ?Discussed importance of controlled and consistent carbohydrate intake throughout the day. Provided examples of ways to balance meals/snacks and encouraged intake of high-fiber, whole grain complex carbohydrates. Teach back method used. ? ?Medications reviewed and include: protonix, LRS w/ 5% dextose  @75ml/hr, heparin, insulin, KCl ? ?Labs reviewed: Na 132(L), K 3.3(L) ?Wbc- 10.6(L) ?Cbgs- 175, 175, 177, 216, 190, 176 x 24 hrs ? ?NUTRITION - FOCUSED PHYSICAL EXAM: ? ?Flowsheet Row Most Recent Value  ?Orbital Region No depletion  ?Upper Arm Region No depletion  ?Thoracic and Lumbar Region No depletion  ?Buccal Region No depletion  ?Temple Region No depletion  ?Clavicle Bone Region No depletion  ?Clavicle and Acromion Bone Region No depletion  ?Scapular Bone Region No depletion  ?Dorsal Hand No depletion  ?Patellar Region No depletion  ?Anterior Thigh Region No depletion  ?Posterior Calf Region No depletion  ?Edema (RD Assessment) None  ?Hair Reviewed  ?Eyes Reviewed  ?Mouth Reviewed  ?Skin Reviewed  ?Nails Reviewed  ? ?Diet Order:   ?Diet Order   ? ?       ?  Diet NPO time specified  Diet effective now       ?  ? ?  ?  ? ?  ? ?EDUCATION NEEDS:  ? ?Education needs have been addressed ? ?Skin:  Skin Assessment: Reviewed RN Assessment ? ?Last BM:  PTA ? ?Height:  ? ?Ht Readings from Last 1 Encounters:  ?04/04/21 5' 9" (1.753 m)  ? ? ?Weight:  ? ?Wt Readings from Last 1 Encounters:  ?04/04/21 (!) 169 kg  ? ? ?Ideal Body Weight:  72.7 kg ? ?BMI:  Body mass index is 55.02 kg/m?. ? ?Estimated Nutritional Needs:  ? ?Kcal:  3000-3300kcal/day ? ?Protein:  >150g/day ? ?Fluid:  2.2-2.5L/day ? ?Casey Campbell MS, RD, LDN ?Please refer to [facility] for RD and/or RD on-call/weekend/after hours pager ? ?

## 2021-04-05 NOTE — Assessment & Plan Note (Addendum)
Newly diagnosed diabetes mellitus.  Here in the hospital we got him on insulin drip initially and converted over to [facility] insulin and increased up to 50 units daily.  We tried getting medications through medication management on Friday.  The insulin is needed refrigeration and the insulins could not be found at the time of discharge.  I needed to call the diabetes coordinator to set up how to do 70/30 insulin.  We decided on 35 units twice a day I prescribed that into [place] pharmacy.  (Once he is able to get medications through medication management can switch the 70/30 insulin over to long and short acting insulin.  I plan on sending him home on 50 units of Basaglar insulin and 6 units of short acting insulin prior to meals.) ?

## 2021-04-05 NOTE — Op Note (Signed)
[facility] ? Percutaneous Study/Intervention Procedural Note ? ? ?Date of Surgery: 04/05/2021,4:20 PM ? ?Surgeon:Meghan Tiemann, [name]  ? ?Pre-operative Diagnosis: Symptomatic pulmonary emboli with right heart strain and hypoxia ? ?Post-operative diagnosis:  Same ? ?Procedure(s) Performed: ? 1.  Contrast injection right heart and bilateral pulmonary arteries ? 2.  Thrombolysis right pulmonary arteries ? 3.  Mechanical thrombectomy right middle lobe pulmonary artery for removal of pulmonary emboli using the Penumbra CAT 8 thrombectomy catheter. ? 4.  Selective catheter placement right middle lobe pulmonary artery  ?   ? ?Anesthesia: Conscious sedation was administered under my direct supervision by the interventional radiology RN. IV Versed plus fentanyl were utilized. Continuous ECG, pulse oximetry and blood pressure was monitored throughout the entire procedure.  Versed and fentanyl were administered intravenously.  Conscious sedation was administered for a total of 1 hour 45 minutes 56 seconds. ? ?Sheath: 11 French 11 cm Pinnacle sheath right common femoral vein ? ?Contrast: 130 cc  ? ?Fluoroscopy Time: 28.4 minutes ? ?Indications:  Patient presents with massive right-sided pulmonary emboli. The patient is symptomatic with hypoxemia and dyspnea on exertion.  There is evidence of right heart strain on the CT angiogram. The patient is otherwise a good candidate for intervention and even the long-term benefits pulmonary angiography with thrombolysis is offered. The risks and benefits are reviewed long-term benefits are discussed. All questions are answered patient agrees to proceed. ? ?Procedure:  Tony Malone a 21 y.o. male who was identified and appropriate procedural time out was performed.  The patient was then placed supine on the table and prepped and draped in the usual sterile fashion.  Ultrasound was used to evaluate the right common femoral vein.  It was patent, as it was  echolucent and compressible.  A digital ultrasound image was acquired for the permanent record.  A micropuncture needle was used to access the right common femoral vein under direct ultrasound guidance.  A microwire was then advanced under fluoroscopic guidance followed by micro-sheath.  A 0.035 J wire was advanced without resistance and a 5Fr sheath was placed and then upsized to an 8 [place] sheath.   ? ?The wire and pigtail catheter were then negotiated into the right atrium and bolus injection of contrast was utilized to demonstrate the right ventricle and the pulmonary artery outflow.  ? ?A total of 6000 units of heparin was then given and allowed to circulate. ? ?TPA was reconstituted and delivered onto the table. A total of 10 milligrams of TPA was utilized.   10 mg was administered on the right side. This was then allowed to dwell for 20-30 minutes. ? ?The J-wire and pigtail catheter was advanced up to the right atrium where a bolus injection contrast was used to demonstrate the pulmonary artery outflow.  Initially was relatively easy to negotiate the JR4 and cifenline wire into the left side.  I was then able to advance an 8 French 65 cm sheath to help stabilize the situation.  However, negotiating catheters wires sheaths into the right distal pulmonary artery proved to be exceedingly difficult.  Wires kept curling and then bouncing back into the left.  Several times I was able to get a wire in but could not negotiate a catheter.  This was true of the penumbra CAT 12 device that I attempted to advance over the Amplatz wire.  Ultimately I was able to exchange the 65 cm 8 French sheath for a 90 cm 8 [place] sheath and negotiate  this into the pulmonary artery.  At this point I was able to initiate tPA as noted above.  Subsequently, I was able to perform mechanical thrombectomy of the right main pulmonary artery and right middle lobe pulmonary artery and segmental branch.  However I was never able to negotiate a  wire and catheter into the lower lobe or the right upper lobe pulmonary arteries.   ? ?The catheter was then positioned into the right middle lobe pulmonary artery and again hand-injection contrast was performed to verify position and evaluate the distal anatomy.  Multiple passes were made using mechanical aspiration in association with the wire as a separator.   ? ?Pigtail catheter was then reintroduced over the Amplatz wire after removing the penumbra catheter and positioned in the pulmonary outflow tract.  A bolus injection of contrast was then used to create a final image of the pulmonary vasculature. ? ?After review these images the catheter and sheath were removed and pressure held. There were no immediate complications. ? ? ? ?Findings:  ? Right heart imaging:  Right atrium and right ventricle and the pulmonary outflow tract appears normal ? Right lung: Initially there is complete nonvisualization of the right main pulmonary artery and all distal branches.  After thrombectomy the right main pulmonary artery is opacified fairly well and there is now filling of the right middle lobe however the right lower lobe and right upper lobe continued continue to be absent. ? Left lung: Essentially normal no evidence of mid or distal pulmonary emboli ? ? ? ?Disposition: Patient was taken to the recovery room in stable condition having tolerated the procedure well. ? ?[name] ?04/05/2021,4:20 PM  ?

## 2021-04-05 NOTE — Assessment & Plan Note (Signed)
BMI recorded at 55.02 ?

## 2021-04-05 NOTE — Interval H&P Note (Signed)
History and Physical Interval Note: ? ?04/05/2021 ?1:21 PM ? ?Tony Malone  has presented today for surgery, with the diagnosis of massive PE with right heart strain.  The various methods of treatment have been discussed with the patient and family. After consideration of risks, benefits and other options for treatment, the patient has consented to  Procedure(s): ?PULMONARY THROMBECTOMY (Right) as a surgical intervention.  The patient's history has been reviewed, patient examined, no change in status, stable for surgery.  I have reviewed the patient's chart and labs.  Questions were answered to the patient's satisfaction.   ? ? ?[name] ? ? ?

## 2021-04-06 ENCOUNTER — Inpatient Hospital Stay (HOSPITAL_COMMUNITY)
Admit: 2021-04-06 | Discharge: 2021-04-06 | Disposition: A | Discharge status: Home / Self Care | Payer: Self-pay | Attending: Vascular Surgery | Admitting: Vascular Surgery

## 2021-04-06 ENCOUNTER — Encounter: Payer: Self-pay | Admitting: Vascular Surgery

## 2021-04-06 ENCOUNTER — Other Ambulatory Visit: Payer: Self-pay

## 2021-04-06 ENCOUNTER — Inpatient Hospital Stay: Payer: Self-pay

## 2021-04-06 DIAGNOSIS — I513 Intracardiac thrombosis, not elsewhere classified: Secondary | ICD-10-CM

## 2021-04-06 DIAGNOSIS — R079 Chest pain, unspecified: Secondary | ICD-10-CM

## 2021-04-06 LAB — CBC
HCT: 41.8 % (ref 39.0–52.0)
Hemoglobin: 14.5 g/dL (ref 13.0–17.0)
MCH: 29.8 pg (ref 26.0–34.0)
MCHC: 34.7 g/dL (ref 30.0–36.0)
MCV: 85.8 fL (ref 80.0–100.0)
Platelets: 179 10*3/uL (ref 150–400)
RBC: 4.87 MIL/uL (ref 4.22–5.81)
RDW: 11.9 % (ref 11.5–15.5)
WBC: 11.1 10*3/uL — ABNORMAL HIGH (ref 4.0–10.5)
nRBC: 0 % (ref 0.0–0.2)

## 2021-04-06 LAB — GLUCOSE, CAPILLARY
Glucose-Capillary: 193 mg/dL — ABNORMAL HIGH (ref 70–99)
Glucose-Capillary: 270 mg/dL — ABNORMAL HIGH (ref 70–99)
Glucose-Capillary: 274 mg/dL — ABNORMAL HIGH (ref 70–99)
Glucose-Capillary: 280 mg/dL — ABNORMAL HIGH (ref 70–99)
Glucose-Capillary: 311 mg/dL — ABNORMAL HIGH (ref 70–99)
Glucose-Capillary: 322 mg/dL — ABNORMAL HIGH (ref 70–99)

## 2021-04-06 LAB — HEMOGLOBIN A1C
Hgb A1c MFr Bld: 11.7 % — ABNORMAL HIGH (ref 4.8–5.6)
Hgb A1c MFr Bld: 11.5 % — ABNORMAL HIGH (ref 4.8–5.6)
Mean Plasma Glucose: 289 mg/dL
Mean Plasma Glucose: 283 mg/dL

## 2021-04-06 LAB — BASIC METABOLIC PANEL
Anion gap: 11 (ref 5–15)
BUN: <5 mg/dL — ABNORMAL LOW (ref 6–20)
CO2: 18 mmol/L — ABNORMAL LOW (ref 22–32)
Calcium: 8.6 mg/dL — ABNORMAL LOW (ref 8.9–10.3)
Chloride: 104 mmol/L (ref 98–111)
Creatinine, Ser: 0.66 mg/dL (ref 0.61–1.24)
GFR, Estimated: >60 mL/min (ref >60)
Glucose, Bld: 287 mg/dL — ABNORMAL HIGH (ref 70–99)
Potassium: 3.3 mmol/L — ABNORMAL LOW (ref 3.5–5.1)
Sodium: 133 mmol/L — ABNORMAL LOW (ref 135–145)

## 2021-04-06 LAB — ECHOCARDIOGRAM COMPLETE
Height: 69 in
S' Lateral: 2.7 cm
Weight: 5961.24 oz

## 2021-04-06 LAB — HEPARIN LEVEL (UNFRACTIONATED)
Heparin Unfractionated: 0.22 IU/mL — ABNORMAL LOW (ref 0.30–0.70)
Heparin Unfractionated: 0.36 IU/mL (ref 0.30–0.70)
Heparin Unfractionated: 0.31 IU/mL (ref 0.30–0.70)

## 2021-04-06 MED ORDER — HEPARIN BOLUS VIA INFUSION
1500.0000 [IU] | Freq: Once | INTRAVENOUS | Status: AC
Start: 2021-04-06 — End: 2021-04-06
  Administered 2021-04-06: 1500 [IU] via INTRAVENOUS
  Filled 2021-04-06: qty 1500

## 2021-04-06 MED ORDER — RIGHTEST GM550 BLOOD GLUCOSE W/DEVICE KIT
PACK | 0 refills | Status: AC
  Filled 2021-04-06: qty 1, 30d supply, fill #0

## 2021-04-06 MED ORDER — INSULIN PEN NEEDLE 32G X 4 MM MISC
1.0000 | Freq: Three times a day (TID) | 0 refills | Status: AC
  Filled 2021-04-06: qty 200, 50d supply, fill #0

## 2021-04-06 MED ORDER — RIVAROXABAN (XARELTO) VTE STARTER PACK (15 & 20 MG)
ORAL_TABLET | ORAL | 0 refills | Status: DC
  Filled 2021-04-06: qty 51, 30d supply, fill #0

## 2021-04-06 MED ORDER — RIGHTEST GS550 BLOOD GLUCOSE VI STRP
ORAL_STRIP | 0 refills | Status: DC
  Filled 2021-04-06: qty 100, 25d supply, fill #0

## 2021-04-06 MED ORDER — INSULIN ASPART 100 UNIT/ML IJ SOLN
0.0000 [IU] | Freq: Every day | INTRAMUSCULAR | Status: DC
  Administered 2021-04-06 – 2021-04-07 (×2): 4 [IU] via SUBCUTANEOUS
  Filled 2021-04-06 (×2): qty 1

## 2021-04-06 MED ORDER — BLOOD GLUCOSE MONITOR KIT
PACK | 0 refills | Status: AC
  Filled 2021-04-06: qty 1, fill #0

## 2021-04-06 MED ORDER — POTASSIUM CHLORIDE CRYS ER 20 MEQ PO TBCR
40.0000 meq | EXTENDED_RELEASE_TABLET | Freq: Once | ORAL | Status: AC
Start: 2021-04-06 — End: 2021-04-06
  Administered 2021-04-06: 40 meq via ORAL
  Filled 2021-04-06: qty 2

## 2021-04-06 MED ORDER — RIGHTEST GL300 LANCETS MISC
0 refills | Status: DC
  Filled 2021-04-06: qty 100, 25d supply, fill #0

## 2021-04-06 MED ORDER — INSULIN LISPRO (1 UNIT DIAL) 100 UNIT/ML (KWIKPEN)
6.0000 [IU] | PEN_INJECTOR | Freq: Three times a day (TID) | SUBCUTANEOUS | 1 refills | Status: DC
  Filled 2021-04-06: qty 15, 84d supply, fill #0

## 2021-04-06 MED ORDER — INSULIN ASPART 100 UNIT/ML IJ SOLN
4.0000 [IU] | Freq: Three times a day (TID) | INTRAMUSCULAR | Status: DC
  Administered 2021-04-06 (×3): 4 [IU] via SUBCUTANEOUS
  Filled 2021-04-06 (×2): qty 1

## 2021-04-06 MED ORDER — INSULIN ASPART 100 UNIT/ML IJ SOLN
0.0000 [IU] | Freq: Three times a day (TID) | INTRAMUSCULAR | Status: DC
  Administered 2021-04-06: 11 [IU] via SUBCUTANEOUS
  Administered 2021-04-06 (×2): 8 [IU] via SUBCUTANEOUS
  Administered 2021-04-07 (×2): 11 [IU] via SUBCUTANEOUS
  Administered 2021-04-07 – 2021-04-08 (×3): 8 [IU] via SUBCUTANEOUS
  Filled 2021-04-06 (×8): qty 1

## 2021-04-06 MED ORDER — BASAGLAR KWIKPEN 100 UNIT/ML ~~LOC~~ SOPN
40.0000 [IU] | PEN_INJECTOR | Freq: Every day | SUBCUTANEOUS | 1 refills | Status: DC
  Filled 2021-04-06: qty 15, 37d supply, fill #0

## 2021-04-06 MED ORDER — HEPARIN BOLUS VIA INFUSION
2000.0000 [IU] | Freq: Once | INTRAVENOUS | Status: DC
  Filled 2021-04-06: qty 2000

## 2021-04-06 MED ORDER — PANTOPRAZOLE SODIUM 40 MG PO TBEC
40.0000 mg | DELAYED_RELEASE_TABLET | Freq: Two times a day (BID) | ORAL | Status: DC
Start: 2021-04-06 — End: 2021-04-08
  Administered 2021-04-06 – 2021-04-08 (×4): 40 mg via ORAL
  Filled 2021-04-06 (×4): qty 1

## 2021-04-06 MED ORDER — INSULIN STARTER KIT- PEN NEEDLES (ENGLISH)
1.0000 | Freq: Once | Status: AC
  Administered 2021-04-06: 1
  Filled 2021-04-06: qty 1

## 2021-04-06 MED ORDER — LIVING WELL WITH DIABETES BOOK
Freq: Once | Status: AC
  Filled 2021-04-06: qty 1

## 2021-04-06 NOTE — Assessment & Plan Note (Addendum)
Heparin converted over to Xarelto.Marland Kitchen ?

## 2021-04-06 NOTE — Progress Notes (Signed)
SaO2 91% while ambulating on RA. HR 135, ST. Patient asymptomatic, tol. ambulation well.  ?

## 2021-04-06 NOTE — Progress Notes (Addendum)
Inpatient Diabetes Program Recommendations ? ?AACE/ADA: New Consensus Statement on Inpatient Glycemic Control (2015) ? ?Target Ranges:  Prepandial:   less than 140 mg/dL ?     Peak postprandial:   less than 180 mg/dL (1-2 hours) ?     Critically ill patients:  140 - 180 mg/dL  ? ?Lab Results  ?Component Value Date  ? GLUCAP 311 (H) 04/06/2021  ? HGBA1C 11.7 (H) 04/04/2021  ? ? ?Review of Glycemic Control ? Latest Reference Range & Units 04/05/21 18:57 04/05/21 20:12 04/05/21 21:16 04/05/21 22:58 04/06/21 00:11 04/06/21 07:47 04/06/21 11:55  ?Glucose-Capillary 70 - 99 mg/dL 199 (H) 348 (H) 239 (H) 249 (H) 280 (H) 274 (H) 311 (H)  ? ?Diabetes history: DM 2- New diagnosis ?Outpatient Diabetes medications: None ?Current orders for Inpatient glycemic control:  ?Novolog moderate tid with meals and HS ?Novolog 4 units tid with meals ?Semglee 40 units q HS ? ?Inpatient Diabetes Program Recommendations:   ? ?     Spoke with pt about new diagnosis.  Discussed A1C results with him and explained what an A1C is, basic pathophysiology of DM Type 2, basic home care, importance of checking CBGs and maintaining good CBG control to prevent long-term and short-term complications.  Reviewed signs and symptoms of hyperglycemia and hypoglycemia.  RNs to provide ongoing basic DM education at bedside with this patient.  Have ordered educational booklet and insulin starter kit. ?     Educated patient and spouse on insulin pen use at home.  Reviewed contents of insulin flexpen starter kit.  Reviewed all steps if insulin pen including attachment of needle, 2-unit air shot, dialing up dose, giving injection, removing needle, disposal of sharps, storage of unused insulin, disposal of insulin etc.  Patient able to provide successful return demonstration.  Also reviewed troubleshooting with insulin pen.  MD to give patient Rxs for insulin pens and insulin pen needles.  ?     Patient does not have medical insurance and will need assistance with  medications and f/u with PCP.  TOC has seen patient and has been assisting with f/u appointments. Wife asked lots of good questions.  Told patient to stop drinking liquids with sugar in them  and recommended reduction in CHO intake. We also discussed importance of exercise when he is cleared to do so by MD.  ?    Will follow. LWWD booklet at bedside as well as insulin pen teaching kit. Explained that he needs close f/u with PCP and that insulin adjustments will be needed.  ? ?Thanks,  ?Jenny Simpson, RN, BC-ADM ?Inpatient Diabetes Coordinator ?Pager [phone]  (8a-5p) ? ? ?

## 2021-04-06 NOTE — Plan of Care (Addendum)
?  RD consulted again for nutrition education regarding diabetes.  ? ?Lab Results  ?Component Value Date  ? HGBA1C 11.7 (H) 04/04/2021  ? PTA DM medications are none.  ? ?Labs reviewed: 274-311 (inpatient orders for glycemic control are 0-15 units insulin aspart TID with meals, 0-5 units insulin aspart daily at bedtime, 4 units insulin aspart TID with meals, and 40 units insulin glargine daily).  ? ?RD educated pt on diet for DM yesterday (see RD note on 04/05/21 for further details). Case discussed with RN; pt with more diet related-questions, particularly what he can can when he goes to [employer].  ? ?Spoke with pt at bedside, who reports coping well with new diagnosis. He reports " I think I can do this"; RD provided encouragement and reassurance. Pt shares that when he goes to [employer], he usually has 2 plates of food ("one of regular and one of junk"- mashed potatoes on one plate and french fries and chicken on the other). Reviewed menu items at [facility] and discussed healthier options (non-starchy vegetables, lean proteins, salads). Pt reports that he started drinking water and diet Gatorade PTA. RD discussed low calorie drink mixes and other no/low sugar beverages appropriate for DM.  ? ?RD reinforced importance of self-management to optimize glycemic control. Noted that pt will be set up with the [facility], as he is uninsured. Pt appreciative of visit.   ? ?RD provided "Carbohydrate Counting for People with Diabetes" and "Plate Method" handout from the Academy of Nutrition and Dietetics. Discussed different food groups and their effects on blood sugar, emphasizing carbohydrate-containing foods. Provided list of carbohydrates and recommended serving sizes of common foods. ? ?Discussed importance of controlled and consistent carbohydrate intake throughout the day. Provided examples of ways to balance meals/snacks and encouraged intake of high-fiber, whole grain complex carbohydrates.  Teach back method used. ? ?Expect fair to good compliance. ? ?Body mass index is 55.02 kg/m?Marland Kitchen Pt meets criteria for extreme obesity, class III based on current BMI. Obesity is a complex, chronic medical condition that is optimally managed by a multidisciplinary care team. Weight loss is not an ideal goal for an acute inpatient hospitalization. However, if further work-up for obesity is warranted, consider outpatient referral to outpatient bariatric service and/or [facility].   ? ?Current diet order is heart healthy/ carb modified, patient is consuming approximately 100% of meals at this time. Labs and medications reviewed. RD following for other acute nutrition issues and will continue to follow pt throughout hospitalization.  ? ?Levada Schilling, RD, LDN, CDCES ?Registered Dietitian II ?Certified Diabetes Care and Education Specialist ?Please refer to [facility] for RD and/or RD on-call/weekend/after hours pager   ?

## 2021-04-06 NOTE — Progress Notes (Signed)
ANTICOAGULATION CONSULT NOTE ? ?Pharmacy Consult for IV Heparin  ?Indication: pulmonary embolus ? ?Patient Measurements: ?Height: 5\' 9"  (175.3 cm) ?Weight: (!) 169 kg (372 lb 9.2 oz) ?IBW/kg (Calculated) : 70.7 ?Heparin Dosing Weight: 101.3 kg  ? ?Labs: ?Recent Labs  ?  04/04/21 ?1407 04/04/21 ?1925 04/04/21 ?2158 04/05/21 ?0102 04/05/21 ?04/07/21 04/05/21 ?04/07/21 04/05/21 ?04/07/21 04/05/21 ?1224 04/06/21 ?0234  ?HGB 16.2  --  15.6  --  14.7  --   --   --  14.5  ?HCT 48.1  --  46.4  --  43.3  --   --   --  41.8  ?PLT 211  --  218  --  189  --   --   --  179  ?HEPARINUNFRC  --   --   --   --   --   --  0.21*  --  0.22*  ?CREATININE 0.73  --  0.80   < > 0.65 0.61  --  0.61 0.66  ?TROPONINIHS 6 6  --   --   --   --   --   --   --   ? < > = values in this interval not displayed.  ? ? ? ?Estimated Creatinine Clearance: 225.3 mL/min (by C-G formula based on SCr of 0.66 mg/dL). ? ? ?Medical History: ?History reviewed. No pertinent past medical history. ? ?Assessment: ?Patient is a 21 y/o M with medical history including obesity who presented to the ED 3/15 with pleuritic chest pain. Patient subsequently found to have acute submassive PE and DKA in setting of undiagnosed diabetes. Pharmacy consulted to initiate heparin infusion for acute submassive PE. H&H, platelets remain wnl. Heparin was held today for a thrombectomy/thrombolysis and heparin has now been restarted  ? ?Goal of Therapy:  ?Heparin level 0.3-0.7 units/ml ?Monitor platelets by anticoagulation protocol: Yes ?  ?Plan:  ?3/17:  HL @ 0234 = 0.22, subtherapeutic  ?Will order heparin 1500 units IV X 1 bolus and increase drip rate to 2000 units/hr.  ?Will recheck HL 6 hrs after rate change.  ? ?Nelvin Tomb D ?04/06/2021,4:25 AM ? ? ?

## 2021-04-06 NOTE — Progress Notes (Signed)
*  PRELIMINARY RESULTS* ?Echocardiogram ?2D Echocardiogram has been performed. ? ?Tony Malone, [name] ?04/06/2021, 8:48 AM ?

## 2021-04-06 NOTE — Progress Notes (Signed)
PHARMACIST - PHYSICIAN COMMUNICATION ? ?CONCERNING: IV to Oral Route Change Policy ? ?RECOMMENDATION: ?This patient is receiving pantoprazole by the intravenous route.  Based on criteria approved by the Pharmacy and Therapeutics Committee, the intravenous medication(s) is/are being converted to the equivalent oral dose form(s). ? ? ?DESCRIPTION: ?These criteria include: ?The patient is eating (either orally or via tube) and/or has been taking other orally administered medications for a least 24 hours ?The patient has no evidence of active gastrointestinal bleeding or impaired GI absorption (gastrectomy, short bowel, patient on TNA or NPO). ? ?If you have questions about this conversion, please contact the Pharmacy Department  ? ?Tressie Ellis, RPH ?04/06/2021 11:54 AM  ?

## 2021-04-06 NOTE — Discharge Instructions (Addendum)
70/30 insulin 35 units subcutaneous injection twice a day prescribed upon discharge. ? ?Can convert back to basaglar insulin 50 units at night and novolog 6 units prior to meals through medication management [phone] ? ?Recommend sleep study once insurance obtained ? ? ? ?Plate Method for Diabetes  ? ?Foods with carbohydrates make your blood glucose level go up. The plate method is a simple way to meal plan and control the amount of carbohydrate you eat. ?    ?    ?Use the following guidance to build a healthy plate to control carbohydrates. Divide a 9-inch plate into 3 sections, and consider your beverage the 4th section of your meal: ?Food Group Examples of Foods/Beverages for This Section of your Meal  ?Section 1: Non-starchy vegetables ?Fill ? of your plate to include non-starchy vegetables Asparagus, broccoli, brussels sprouts, cabbage, carrots, cauliflower, celery, cucumber, green beans, mushrooms, peppers, salad greens, tomatoes, or zucchini.  ?Section 2: Protein foods ?Fill ? of your plate to include a lean protein Lean meat, poultry, fish, seafood, cheese, eggs, lean deli meat, tofu, beans, lentils, nuts or nut butters.  ?Section 3: Carbohydrate foods ?Fill ? of your plate to include carbohydrate foods Whole grains, whole wheat bread, brown rice, whole grain pasta, polenta, corn tortillas, fruit, or starchy vegetables (potatoes, green peas, corn, beans, acorn squash, and butternut squash). One cup of milk also counts as a food that contains carbohydrate.  ?Section 4: Beverage ?Choose water or a low-calorie drink for your beverage. Unsweetened tea, coffee, or flavored/sparkling water without added sugar.  ?Image reprinted with permission from The American Diabetes Association.  Copyright 2022 by the American Diabetes Association. ? ? ?Copyright 2022 ? Academy of Nutrition and Dietetics. All rights reserved   ? ?Carbohydrate Counting For People With Diabetes ? ?Foods with carbohydrates make your blood  glucose level go up. Learning how to count carbohydrates can help you control your blood glucose levels. First, identify the foods you eat that contain carbohydrates. Then, using the Foods with Carbohydrates chart, determine about how much carbohydrates are in your meals and snacks. Make sure you are eating foods with fiber, protein, and healthy fat along with your carbohydrate foods. ?Foods with Carbohydrates ?The following table shows carbohydrate foods that have about 15 grams of carbohydrate each. Using measuring cups, spoons, or a food scale when you first begin learning about carbohydrate counting can help you learn about the portion sizes you typically eat. ?The following foods have 15 grams carbohydrate each:  ?Grains ?1 slice bread (1 ounce)  ?1 small tortilla (6-inch size)  ?? large bagel (1 ounce)  ?1/3 cup pasta or rice (cooked)  ?? hamburger or hot dog bun (? ounce)  ?? cup cooked cereal  ?? to ? cup ready-to-eat cereal  ?2 taco shells (5-inch size) Fruit ?1 small fresh fruit (? to 1 cup)  ?? medium banana  ?17 small grapes (3 ounces)  ?1 cup melon or berries  ?? cup canned or frozen fruit  ?2 tablespoons dried fruit (blueberries, cherries, cranberries, raisins)  ?? cup unsweetened fruit juice  ?Starchy Vegetables ?? cup cooked beans, peas, corn, potatoes/sweet potatoes  ?? large baked potato (3 ounces)  ?1 cup acorn or butternut squash  Snack Foods ?3 to 6 crackers  ?8 potato chips or 13 tortilla chips (? ounce to 1 ounce)  ?3 cups popped popcorn  ?Dairy ?3/4 cup (6 ounces) nonfat plain yogurt, or yogurt with sugar-free sweetener  ?1 cup milk  ?1 cup plain rice, soy,  coconut or flavored almond milk Sweets and Desserts ?? cup ice cream or frozen yogurt  ?1 tablespoon jam, jelly, pancake syrup, table sugar, or honey  ?2 tablespoons light pancake syrup  ?1 inch square of frosted cake or 2 inch square of unfrosted cake  ?2 small cookies (2/3 ounce each) or ? large cookie  ?Sometimes you?ll have to estimate  carbohydrate amounts if you don?t know the exact recipe. One cup of mixed foods like soups can have 1 to 2 carbohydrate servings, while some casseroles might have 2 or more servings of carbohydrate. ?Foods that have less than 20 calories in each serving can be counted as ?free? foods. Count 1 cup raw vegetables, or ? cup cooked non-starchy vegetables as ?free? foods. If you eat 3 or more servings at one meal, then count them as 1 carbohydrate serving.  ?Foods without Carbohydrates  ?Not all foods contain carbohydrates. Meat, some dairy, fats, non-starchy vegetables, and many beverages don?t contain carbohydrate. So when you count carbohydrates, you can generally exclude chicken, pork, beef, fish, seafood, eggs, tofu, cheese, butter, sour cream, avocado, nuts, seeds, olives, mayonnaise, water, black coffee, unsweetened tea, and zero-calorie drinks. Vegetables with no or low carbohydrate include green beans, cauliflower, tomatoes, and onions. ?How much carbohydrate should I eat at each meal?  ?Carbohydrate counting can help you plan your meals and manage your weight. Following are some starting points for carbohydrate intake at each meal. Work with your registered dietitian nutritionist to find the best range that works for your blood glucose and weight.  ? To Lose Weight To Maintain Weight  ?Women 2 - 3 carb servings 3 - 4 carb servings  ?Men 3 - 4 carb servings 4 - 5 carb servings  ?Checking your blood glucose after meals will help you know if you need to adjust the timing, type, or number of carbohydrate servings in your meal plan. Achieve and keep a healthy body weight by balancing your food intake and physical activity. ? ?Tips ?How should I plan my meals?  ?Plan for half the food on your plate to include non-starchy vegetables, like salad greens, broccoli, or carrots. Try to eat 3 to 5 servings of non-starchy vegetables every day. Have a protein food at each meal. Protein foods include chicken, fish, meat, eggs,  or beans (note that beans contain carbohydrate). These two food groups (non-starchy vegetables and proteins) are low in carbohydrate. If you fill up your plate with these foods, you will eat less carbohydrate but still fill up your stomach. Try to limit your carbohydrate portion to ? of the plate.  ?What fats are healthiest to eat?  ?Diabetes increases risk for heart disease. To help protect your heart, eat more healthy fats, such as olive oil, nuts, and avocado. Eat less saturated fats like butter, cream, and high-fat meats, like bacon and sausage. Avoid trans fats, which are in all foods that list ?partially hydrogenated oil? as an ingredient. ?What should I drink?  ?Choose drinks that are not sweetened with sugar. The healthiest choices are water, carbonated or seltzer waters, and tea and coffee without added sugars.  ?Sweet drinks will make your blood glucose go up very quickly. One serving of soda or energy drink is ? cup. It is best to drink these beverages only if your blood glucose is low.  ?Artificially sweetened, or diet drinks, typically do not increase your blood glucose if they have zero calories in them. Read labels of beverages, as some diet drinks do have carbohydrate  and will raise your blood glucose. ?Label Reading Tips ?Read Nutrition Facts labels to find out how many grams of carbohydrate are in a food you want to eat. Don?t forget: sometimes serving sizes on the label aren?t the same as how much food you are going to eat, so you may need to calculate how much carbohydrate is in the food you are serving yourself.  ? ?Carbohydrate Counting for People with Diabetes Sample 1-Day Menu  ?Breakfast ? cup yogurt, low fat, low sugar (1 carbohydrate serving)  ?? cup cereal, ready-to-eat, unsweetened (1 carbohydrate serving)  ?1 cup strawberries (1 carbohydrate serving)  ?? cup almonds (? carbohydrate serving)  ?Lunch 1, 5 ounce can chunk light tuna  ?2 ounces cheese, low fat cheddar  ?6 whole wheat  crackers (1 carbohydrate serving)  ?1 small apple (1? carbohydrate servings)  ?? cup carrots (? carbohydrate serving)  ?? cup snap peas  ?1 cup 1% milk (1 carbohydrate serving)   ?Evening Meal Stir fry made

## 2021-04-06 NOTE — TOC Progression Note (Signed)
Transition of Care (TOC) - Progression Note  ? ? ?Patient Details  ?Name: Tony Malone ?MRN: [id number] ?Date of Birth: [birth year] ? ?Transition of Care (TOC) CM/SW Contact  ?Allayne Butcher, RN ?Phone Number: ?04/06/2021, 3:44 PM ? ?Clinical Narrative:    ?Patient may discharge over the weekend.  MD has sent prescriptions over to Medication Management.  RNCM got medications and delivered them to the patient so he will have them at discharge.  Glucometer delivered with the medications, includes his insulin, pen needles, lancet, test strips, and xaralto starter pack.  ? ? ?Expected Discharge Plan: Home/Self Care ?Barriers to Discharge: Continued Medical Work up ? ?Expected Discharge Plan and Services ?Expected Discharge Plan: Home/Self Care ?  ?Discharge Planning Services: CM Consult, [facility], Medication Assistance ?  ?Living arrangements for the past 2 months: Single Family Home ?                ?DME Arranged: N/A ?DME Agency: NA ?  ?  ?  ?HH Arranged: NA ?HH Agency: NA ?  ?  ?  ? ? ?Social Determinants of Health (SDOH) Interventions ?  ? ?Readmission Risk Interventions ?No flowsheet data found. ? ?

## 2021-04-06 NOTE — Progress Notes (Signed)
ANTICOAGULATION CONSULT NOTE ? ?Pharmacy Consult for IV Heparin  ?Indication: pulmonary embolus ? ?Patient Measurements: ?Height: 5\' 9"  (175.3 cm) ?Weight: (!) 169 kg (372 lb 9.2 oz) ?IBW/kg (Calculated) : 70.7 ?Heparin Dosing Weight: 101.3 kg  ? ?Labs: ?Recent Labs  ?  04/04/21 ?1407 04/04/21 ?1925 04/04/21 ?2158 04/05/21 ?0102 04/05/21 ?04/07/21 04/05/21 ?04/07/21 04/05/21 ?0952 04/05/21 ?1224 04/06/21 ?0234 04/06/21 ?1050 04/06/21 ?1625  ?HGB 16.2  --  15.6  --  14.7  --   --   --  14.5  --   --   ?HCT 48.1  --  46.4  --  43.3  --   --   --  41.8  --   --   ?PLT 211  --  218  --  189  --   --   --  179  --   --   ?HEPARINUNFRC  --   --   --   --   --   --    < >  --  0.22* 0.36 0.31  ?CREATININE 0.73  --  0.80   < > 0.65 0.61  --  0.61 0.66  --   --   ?TROPONINIHS 6 6  --   --   --   --   --   --   --   --   --   ? < > = values in this interval not displayed.  ? ? ? ?Estimated Creatinine Clearance: 225.3 mL/min (by C-G formula based on SCr of 0.66 mg/dL). ? ? ?Medical History: ?History reviewed. No pertinent past medical history. ? ?Assessment: ?Patient is a 21 y/o M with medical history including obesity who presented to the ED 3/15 with pleuritic chest pain. Patient subsequently found to have acute submassive PE and DKA in setting of undiagnosed diabetes. Pharmacy consulted to initiate heparin infusion for acute submassive PE. ? ?Patient is status post pulmonary thrombectomy 3/16 where thrombus burden was removed from right middle lobe but residual thrombus was not amenable to intervention in the right upper and lower lobes ? ?ECHO further concerning for echogenic mass in the right ventricle concerning for thrombus in transit ? ?Goal of Therapy:  ?Heparin level 0.3-0.7 units/ml ?Monitor platelets by anticoagulation protocol: Yes ? ?Date/Time HL/aPtt Rate  Comment ?3/17@1050  HL 0.36 2000un/hr Therapeutic x1 ?3/17@1625  HL 0.31 2000un/hr Therapeutic x2 ? ?Plan:  ?--Heparin level is therapeutic ?--Continue IV heparin at  2000 units/hr ?--Re-check HL with AM labs ?--Daily CBC per protocol while on IV heparin ?--Follow-up transition to [facility] when appropriate (note BMI 55) ? ?Arelyn Gauer Rodriguez-Guzman PharmD, BCPS ?04/06/2021 5:47 PM ? ? ?

## 2021-04-06 NOTE — Progress Notes (Signed)
?Progress Note ? ? ?Patient: Tony Malone [id number] DOB: [birth year] DOA: 04/04/2021     2 ?DOS: the patient was seen and examined on 04/06/2021 ?  ? ? ?Assessment and Plan: ?Acute pulmonary embolism with acute cor pulmonale ([place]) ?The patient was brought to the vascular lab yesterday for massive right-sided pulmonary embolism.  The patient had pulmonary thrombectomy by Dr. Delana Meyer.  The embolism in the right main pulmonary artery is largely removed but the patient did have residual clot in the right upper and lower lobes.  Clot was sent off for pathology to see if this is chronic thrombus or not. Continue heparin drip for now.  Hopefully will be able to convert over to Xarelto upon discharge.  Echocardiogram was a difficult study but did show likely clot in the right ventricle.  Ultrasound the lower extremities negative. ? ?Thrombus of right ventricle ?Currently on IV heparin ? ?DKA (diabetic ketoacidosis) ([place]) ?Newly diagnosed diabetes mellitus.  We will get dietitian consult.  Diet discussed with patient and visitor at the bedside and family on the phone.  Diet exercise and weight loss discussed.  Nursing staff to teach the patient how to inject insulin.  Currently on 40 units of Semglee insulin at night and short acting insulin prior to meals. ? ?Hyponatremia ?Secondary to DKA.  Last sodium 133 ? ?Hypokalemia ?Replace oral potassium today ? ?Morbid obesity with BMI of 50.0-59.9, adult ([place]) ?BMI recorded at 55.02 ? ? ? ?  ? ?Subjective: Patient states that his shortness of breath has been going on for a while.  No chest pain today.  Feels better with his shortness of breath.  Admitted with DKA and found to have pulmonary embolism. ? ?Physical Exam: ?Vitals:  ? 04/06/21 0800 04/06/21 0900 04/06/21 1000 04/06/21 1100  ?BP: (!) 148/99 136/72 (!) 139/96 136/89  ?Pulse: (!) 116 (!) 120 (!) 115 (!) 117  ?Resp: (!) 26 (!) 23 (!) 21 10  ?Temp:      ?TempSrc:      ?SpO2: 95% 95% 97% 96%  ?Weight:      ?Height:       ? ?Physical Exam ?HENT:  ?   Head: Normocephalic.  ?   Mouth/Throat:  ?   Pharynx: No oropharyngeal exudate.  ?Eyes:  ?   General: Lids are normal.  ?   Conjunctiva/sclera: Conjunctivae normal.  ?Cardiovascular:  ?   Rate and Rhythm: Regular rhythm. Tachycardia present.  ?   Heart sounds: Normal heart sounds, S1 normal and S2 normal.  ?Pulmonary:  ?   Breath sounds: Examination of the right-lower field reveals decreased breath sounds. Examination of the left-lower field reveals decreased breath sounds. Decreased breath sounds present. No wheezing, rhonchi or rales.  ?Abdominal:  ?   Palpations: Abdomen is soft.  ?   Tenderness: There is no abdominal tenderness.  ?Musculoskeletal:  ?   Right lower leg: Swelling present.  ?   Left lower leg: Swelling present.  ?Skin: ?   General: Skin is warm.  ?   Findings: No rash.  ?Neurological:  ?   Mental Status: He is alert and oriented to person, place, and time.  ?  ?Data Reviewed: ?Ultrasound lower extremity bilaterally showed no DVT.  Echocardiogram showed a thrombus in the right ventricle ? ?Family Communication: Spoke with family on the phone and visitor at the bedside ? ?Disposition: ?Status is: Inpatient ?Remains inpatient appropriate because: Patient had a massive pulmonary embolism and thrombectomy yesterday.  Still on heparin drip today. ?  ?  Planned Discharge Destination: Home ? ?Author: ?Loletha Grayer, MD ?04/06/2021 12:20 PM ? ?For on call review [email or web address].  ?

## 2021-04-06 NOTE — Progress Notes (Addendum)
ANTICOAGULATION CONSULT NOTE ? ?Pharmacy Consult for IV Heparin  ?Indication: pulmonary embolus ? ?Patient Measurements: ?Height: 5\' 9"  (175.3 cm) ?Weight: (!) 169 kg (372 lb 9.2 oz) ?IBW/kg (Calculated) : 70.7 ?Heparin Dosing Weight: 101.3 kg  ? ?Labs: ?Recent Labs  ?  04/04/21 ?1407 04/04/21 ?1925 04/04/21 ?2158 04/05/21 ?0102 04/05/21 ?[id number] 04/05/21 ?[id number] 04/05/21 ?[id number] 04/05/21 ?1224 04/06/21 ?0234 04/06/21 ?1050  ?HGB 16.2  --  15.6  --  14.7  --   --   --  14.5  --   ?HCT 48.1  --  46.4  --  43.3  --   --   --  41.8  --   ?PLT 211  --  218  --  189  --   --   --  179  --   ?HEPARINUNFRC  --   --   --   --   --   --  0.21*  --  0.22* 0.36  ?CREATININE 0.73  --  0.80   < > 0.65 0.61  --  0.61 0.66  --   ?TROPONINIHS 6 6  --   --   --   --   --   --   --   --   ? < > = values in this interval not displayed.  ? ? ? ?Estimated Creatinine Clearance: 225.3 mL/min (by C-G formula based on SCr of 0.66 mg/dL). ? ? ?Medical History: ?History reviewed. No pertinent past medical history. ? ?Assessment: ?Patient is a 21 y/o M with medical history including obesity who presented to the ED 3/15 with pleuritic chest pain. Patient subsequently found to have acute submassive PE and DKA in setting of undiagnosed diabetes. Pharmacy consulted to initiate heparin infusion for acute submassive PE. ? ?Patient is status post pulmonary thrombectomy 3/16 where thrombus burden was removed from right middle lobe but residual thrombus was not amenable to intervention in the right upper and lower lobes ? ?ECHO further concerning for echogenic mass in the right ventricle concerning for thrombus in transit ? ?Goal of Therapy:  ?Heparin level 0.3-0.7 units/ml ?Monitor platelets by anticoagulation protocol: Yes ?  ?Plan:  ?--Heparin level is therapeutic ?--Continue IV heparin at 2000 units/hr ?--Re-check confirmatory HL in 6 hours ?--Daily CBC per protocol while on IV heparin ?--Follow-up transition to [facility] when appropriate (note BMI 55) ? ?Benita Gutter ?04/06/2021,11:44 AM ? ? ?

## 2021-04-07 DIAGNOSIS — R000 Tachycardia, unspecified: Secondary | ICD-10-CM

## 2021-04-07 LAB — CBC
HCT: 40.3 % (ref 39.0–52.0)
Hemoglobin: 13.5 g/dL (ref 13.0–17.0)
MCH: 29.5 pg (ref 26.0–34.0)
MCHC: 33.5 g/dL (ref 30.0–36.0)
MCV: 88 fL (ref 80.0–100.0)
Platelets: 205 10*3/uL (ref 150–400)
RBC: 4.58 MIL/uL (ref 4.22–5.81)
RDW: 11.9 % (ref 11.5–15.5)
WBC: 9.8 10*3/uL (ref 4.0–10.5)
nRBC: 0 % (ref 0.0–0.2)

## 2021-04-07 LAB — BASIC METABOLIC PANEL
Anion gap: 14 (ref 5–15)
BUN: 6 mg/dL (ref 6–20)
CO2: 19 mmol/L — ABNORMAL LOW (ref 22–32)
Calcium: 8.6 mg/dL — ABNORMAL LOW (ref 8.9–10.3)
Chloride: 100 mmol/L (ref 98–111)
Creatinine, Ser: 0.74 mg/dL (ref 0.61–1.24)
GFR, Estimated: >60 mL/min (ref >60)
Glucose, Bld: 296 mg/dL — ABNORMAL HIGH (ref 70–99)
Potassium: 3.3 mmol/L — ABNORMAL LOW (ref 3.5–5.1)
Sodium: 133 mmol/L — ABNORMAL LOW (ref 135–145)

## 2021-04-07 LAB — MAGNESIUM: Magnesium: 2 mg/dL (ref 1.7–2.4)

## 2021-04-07 LAB — GLUCOSE, CAPILLARY
Glucose-Capillary: 260 mg/dL — ABNORMAL HIGH (ref 70–99)
Glucose-Capillary: 329 mg/dL — ABNORMAL HIGH (ref 70–99)
Glucose-Capillary: 303 mg/dL — ABNORMAL HIGH (ref 70–99)
Glucose-Capillary: 314 mg/dL — ABNORMAL HIGH (ref 70–99)

## 2021-04-07 LAB — HEPARIN LEVEL (UNFRACTIONATED)
Heparin Unfractionated: 0.29 IU/mL — ABNORMAL LOW (ref 0.30–0.70)
Heparin Unfractionated: 0.34 IU/mL (ref 0.30–0.70)

## 2021-04-07 MED ORDER — RIVAROXABAN 15 MG PO TABS
15.0000 mg | ORAL_TABLET | Freq: Two times a day (BID) | ORAL | Status: DC
  Administered 2021-04-07 – 2021-04-08 (×3): 15 mg via ORAL
  Filled 2021-04-07 (×5): qty 1

## 2021-04-07 MED ORDER — INSULIN ASPART 100 UNIT/ML IJ SOLN
6.0000 [IU] | Freq: Three times a day (TID) | INTRAMUSCULAR | Status: DC
  Administered 2021-04-07 – 2021-04-08 (×5): 6 [IU] via SUBCUTANEOUS
  Filled 2021-04-07 (×5): qty 1

## 2021-04-07 MED ORDER — HEPARIN BOLUS VIA INFUSION
1500.0000 [IU] | Freq: Once | INTRAVENOUS | Status: AC
  Administered 2021-04-07: 1500 [IU] via INTRAVENOUS
  Filled 2021-04-07: qty 1500

## 2021-04-07 MED ORDER — POTASSIUM CHLORIDE CRYS ER 20 MEQ PO TBCR
40.0000 meq | EXTENDED_RELEASE_TABLET | Freq: Once | ORAL | Status: AC
  Administered 2021-04-07: 40 meq via ORAL
  Filled 2021-04-07: qty 2

## 2021-04-07 MED ORDER — INSULIN GLARGINE-YFGN 100 UNIT/ML ~~LOC~~ SOLN
45.0000 [IU] | Freq: Every day | SUBCUTANEOUS | Status: DC
  Administered 2021-04-07: 45 [IU] via SUBCUTANEOUS
  Filled 2021-04-07 (×2): qty 0.45

## 2021-04-07 MED ORDER — RIVAROXABAN 20 MG PO TABS: 20.0000 mg | ORAL_TABLET | Freq: Every day | ORAL | Status: DC

## 2021-04-07 NOTE — Assessment & Plan Note (Addendum)
Patient still tachycardic.  Hesitant on starting medications for this at this point.  Continue to monitor for now.  If still tachycardic at follow-up appointment can potentially start low-dose Toprol-XL. ?

## 2021-04-07 NOTE — Progress Notes (Signed)
?   04/07/21 1313  ?Escalate  ?MEWS: Escalate Yellow: discuss with charge nurse/RN and consider discussing with provider and RRT  ?Notify: Charge Nurse/RN  ?Name of Charge Nurse/RN Notified Siri Cole RN  ?Date Charge Nurse/RN Notified 04/07/21  ?Time Charge Nurse/RN Notified 1313  ?Document  ?Progress note created (see row info) Yes  ? ?Pt has a history of Red and Yellow MEWS in the ICU; upon transfer pt's HR 120, pt verbalized that is his norm; will continue to obtain VS every 4 hours and monitor pt throughout this writer's shift ?

## 2021-04-07 NOTE — Progress Notes (Signed)
ANTICOAGULATION CONSULT NOTE ? ?Pharmacy Consult for IV Heparin  ?Indication: pulmonary embolus ? ?Patient Measurements: ?Height: 5\' 9"  (175.3 cm) ?Weight: (!) 169 kg (372 lb 9.2 oz) ?IBW/kg (Calculated) : 70.7 ?Heparin Dosing Weight: 101.3 kg  ? ?Labs: ?Recent Labs  ?  04/04/21 ?1407 04/04/21 ?1925 04/04/21 ?2158 04/05/21 ?0414 04/05/21 ?04/07/21 04/05/21 ?0952 04/05/21 ?1224 04/06/21 ?0234 04/06/21 ?1050 04/06/21 ?1625 04/07/21 ?0345  ?HGB 16.2  --    < > 14.7  --   --   --  14.5  --   --  13.5  ?HCT 48.1  --    < > 43.3  --   --   --  41.8  --   --  40.3  ?PLT 211  --    < > 189  --   --   --  179  --   --  205  ?HEPARINUNFRC  --   --   --   --   --    < >  --  0.22* 0.36 0.31 0.29*  ?CREATININE 0.73  --    < > 0.65 0.61  --  0.61 0.66  --   --   --   ?TROPONINIHS 6 6  --   --   --   --   --   --   --   --   --   ? < > = values in this interval not displayed.  ? ? ? ?Estimated Creatinine Clearance: 225.3 mL/min (by C-G formula based on SCr of 0.66 mg/dL). ? ? ?Medical History: ?History reviewed. No pertinent past medical history. ? ?Assessment: ?Patient is a 21 y/o M with medical history including obesity who presented to the ED 3/15 with pleuritic chest pain. Patient subsequently found to have acute submassive PE and DKA in setting of undiagnosed diabetes. Pharmacy consulted to initiate heparin infusion for acute submassive PE. ? ?Patient is status post pulmonary thrombectomy 3/16 where thrombus burden was removed from right middle lobe but residual thrombus was not amenable to intervention in the right upper and lower lobes ? ?ECHO further concerning for echogenic mass in the right ventricle concerning for thrombus in transit ? ?Goal of Therapy:  ?Heparin level 0.3-0.7 units/ml ?Monitor platelets by anticoagulation protocol: Yes ? ?Date/Time HL/aPtt Rate  Comment ?3/17@1050  HL 0.36 2000un/hr Therapeutic x1 ?3/17@1625  HL 0.31 2000un/hr Therapeutic x2 ?3/18@0345     HL 0.29           2200un/hr        Therapeutic X 3   ? ?Plan:  ?3/18:  HL @ 0345 = 0.29, subtherapeutic  ?Will order Heparin 1500 units IV X 1 bolus and increase drip rate to 2200 units/hr.  ?Will recheck HL 6 hrs after rate change.  ? ?Windsor Goeken D ?04/07/2021 5:19 AM ? ? ?

## 2021-04-07 NOTE — Progress Notes (Signed)
?   04/07/21 1651  ?Assess: MEWS Score  ?Temp 98.3 ?F (36.8 ?C)  ?BP 134/80  ?Pulse Rate (!) 128  ?Resp 19  ?Level of Consciousness Alert  ?SpO2 92 %  ?O2 Device Room Air  ?Assess: MEWS Score  ?MEWS Temp 0  ?MEWS Systolic 0  ?MEWS Pulse 2  ?MEWS RR 0  ?MEWS LOC 0  ?MEWS Score 2  ?MEWS Score Color Yellow  ?Assess: if the MEWS score is Yellow or Red  ?Were vital signs taken at a resting state? Yes  ?Focused Assessment No change from prior assessment  ?Does the patient meet 2 or more of the SIRS criteria? No  ?MEWS guidelines implemented *See Row Information* No, previously yellow, continue vital signs every 4 hours  ?Treat  ?MEWS Interventions Other (Comment) ?(will continue to take VS q 4hr)  ?Document  ?Progress note created (see row info) Yes  ?Assess: SIRS CRITERIA  ?SIRS Temperature  0  ?SIRS Pulse 1  ?SIRS Respirations  0  ?SIRS WBC 0  ?SIRS Score Sum  1  ? ?Will continue to monitor pt throughout this writer's shift ?

## 2021-04-07 NOTE — Progress Notes (Signed)
0800 Patient awakened for 0800 CBG check. Patient very nonchalant about diabetes. Encouraged to do own administration of Insulin. Teaching done about diet and exercise. Patient must be pushed to do self care. Girlfriend ask not to help patient with self care. Cautioned about side effects of noncompliance with diet, exercise and self care. Patients' has flat affect.1000 Up in chair with minimal help.1200 Report called to Robyn on 1C. ?

## 2021-04-07 NOTE — Progress Notes (Signed)
?Progress Note ? ? ?Patient: Tony Malone ZOX:[id number] DOB: 08/31/2000 DOA: 04/04/2021     3 ?DOS: the patient was seen and examined on 04/07/2021 ?  ? ? ?Assessment and Plan: ?Acute pulmonary embolism with acute cor pulmonale (HCC) ?The patient was brought to the vascular lab on 04/05/2021 for massive right-sided pulmonary embolism.  The patient had pulmonary thrombectomy by Dr. Gilda Crease.  The embolism in the right main pulmonary artery is largely removed but the patient did have residual clot in the right upper and lower lobes.  Clot was sent off for pathology to see if this is chronic thrombus or not. Echocardiogram was a difficult study but did show likely clot in the right ventricle.  Ultrasound the lower extremities negative.  Case discussed with Dr. Evie Lacks vascular surgery covering for the weekend and was okay with switching over to Xarelto for today.  Potential disposition tomorrow if tolerates Xarelto today. ? ?Thrombus of right ventricle ?IV heparin will be converted over to oral Xarelto. ? ?DKA (diabetic ketoacidosis) (HCC) ?Newly diagnosed diabetes mellitus.  Nursing staff to teach the patient how to inject insulin.  Increase to 45 units of Semglee insulin at night and short acting insulin prior to meals. ? ?Hyponatremia ?Secondary to DKA.  Last sodium 133 ? ?Hypokalemia ?Replace oral potassium again today ? ?Morbid obesity with BMI of 50.0-59.9, adult (HCC) ?BMI recorded at 55.02 ? ?Tachycardia ?Patient still tachycardic.  Hesitant on starting medications for this at this point.  Continue to monitor for now. ? ? ? ? ?  ? ?Subjective: Patient feeling better.  He states that he has been having shortness of breath for little while.  Came in with chest pain shortness of breath and diabetic ketoacidosis.  Diagnosed with a large right-sided pulmonary embolism. ? ?Physical Exam: ?Vitals:  ? 04/07/21 0700 04/07/21 0800 04/07/21 0900 04/07/21 1247  ?BP:  123/71  (!) 142/105  ?Pulse: (!) 118 (!) 115 (!) 117 (!)  119  ?Resp: (!) 22 20 (!) 38 16  ?Temp:  99.5 ?F (37.5 ?C)  98.4 ?F (36.9 ?C)  ?TempSrc:  Oral  Oral  ?SpO2: 100% 98% 91% 95%  ?Weight:      ?Height:      ? ?Physical Exam ?HENT:  ?   Head: Normocephalic.  ?   Mouth/Throat:  ?   Pharynx: No oropharyngeal exudate.  ?Eyes:  ?   General: Lids are normal.  ?   Conjunctiva/sclera: Conjunctivae normal.  ?Cardiovascular:  ?   Rate and Rhythm: Regular rhythm. Tachycardia present.  ?   Heart sounds: Normal heart sounds, S1 normal and S2 normal.  ?Pulmonary:  ?   Breath sounds: Examination of the right-lower field reveals decreased breath sounds. Examination of the left-lower field reveals decreased breath sounds. Decreased breath sounds present. No wheezing, rhonchi or rales.  ?Abdominal:  ?   Palpations: Abdomen is soft.  ?   Tenderness: There is no abdominal tenderness.  ?Musculoskeletal:  ?   Right lower leg: Swelling present.  ?   Left lower leg: Swelling present.  ?Skin: ?   General: Skin is warm.  ?   Findings: No rash.  ?Neurological:  ?   Mental Status: He is alert and oriented to person, place, and time.  ?  ?Data Reviewed: ? ?Laboratory and radiological data reviewed today ? ?Family Communication: Girlfriend at the bedside ? ?Disposition: ?Status is: Inpatient ?Remains inpatient appropriate because: Being converted from heparin drip over to Xarelto ?  ?Planned Discharge Destination: Home likely  tomorrow. ? ?Author: ?Alford Highland, MD ?04/07/2021 2:04 PM ? ?For on call review [email or web address].  ?

## 2021-04-07 NOTE — Progress Notes (Signed)
ANTICOAGULATION CONSULT NOTE ? ?Pharmacy Consult for IV Heparin >> Xarelto ?Indication: pulmonary embolus ? ?Patient Measurements: ?Height: 5\' 9"  (175.3 cm) ?Weight: (!) 169 kg (372 lb 9.2 oz) ?IBW/kg (Calculated) : 70.7 ?Heparin Dosing Weight: 101.3 kg  ? ?Labs: ?Recent Labs  ?  04/04/21 ?1407 04/04/21 ?1925 04/04/21 ?2158 04/05/21 ?0414 04/05/21 ?[id number] 04/05/21 ?1224 04/06/21 ?0234 04/06/21 ?1050 04/06/21 ?1625 04/07/21 ?0345 04/07/21 ?1141  ?HGB 16.2  --    < > 14.7  --   --  14.5  --   --  13.5  --   ?HCT 48.1  --    < > 43.3  --   --  41.8  --   --  40.3  --   ?PLT 211  --    < > 189  --   --  179  --   --  205  --   ?HEPARINUNFRC  --   --   --   --    < >  --  0.22*   < > 0.31 0.29* 0.34  ?CREATININE 0.73  --    < > 0.65   < > 0.61 0.66  --   --  0.74  --   ?TROPONINIHS 6 6  --   --   --   --   --   --   --   --   --   ? < > = values in this interval not displayed.  ? ? ? ?Estimated Creatinine Clearance: 225.3 mL/min (by C-G formula based on SCr of 0.74 mg/dL). ? ? ?Medical History: ?History reviewed. No pertinent past medical history. ? ?Assessment: ?Patient is a 21 y/o M with medical history including obesity who presented to the ED 3/15 with pleuritic chest pain. Patient subsequently found to have acute submassive PE and DKA in setting of undiagnosed diabetes. Pharmacy consulted to initiate heparin infusion for acute submassive PE. ? ?Patient is status post pulmonary thrombectomy 3/16 where thrombus burden was removed from right middle lobe but residual thrombus was not amenable to intervention in the right upper and lower lobes ? ?ECHO further concerning for echogenic mass in the right ventricle concerning for thrombus in transit ? ?Goal of Therapy:  ?Heparin level 0.3-0.7 units/ml ?Monitor platelets by anticoagulation protocol: Yes ? ?Date/Time HL/aPtt Rate  Comment ?3/17@1050  HL 0.36 2000un/hr Therapeutic x1 ?3/17@1625  HL 0.31 2000un/hr Therapeutic x2 ?3/18@0345     HL 0.29           inc to 2200un/hr         subthera ?3/18@1141  HL 0.34 2200un/hr Therapeutic x1 ? ?Plan:  ?Will transition to Xarelto, last anti-Xa level 0.34. Can stop drip and start with first dose of Xarelto immediately. ?For PE dosing, begin with Xarelto 15mg  BID x21d (3/18-4/07 PM); then decrease to Xarelto 20mg  QHS (4/8 PM >> ?Heparin stopped. ?CTM Q72h CBC's ? ?Tony Malone ?04/07/2021 12:18 PM ? ? ?

## 2021-04-08 LAB — BASIC METABOLIC PANEL
Anion gap: 10 (ref 5–15)
BUN: 8 mg/dL (ref 6–20)
CO2: 23 mmol/L (ref 22–32)
Calcium: 8.6 mg/dL — ABNORMAL LOW (ref 8.9–10.3)
Chloride: 100 mmol/L (ref 98–111)
Creatinine, Ser: 0.74 mg/dL (ref 0.61–1.24)
GFR, Estimated: >60 mL/min (ref >60)
Glucose, Bld: 293 mg/dL — ABNORMAL HIGH (ref 70–99)
Potassium: 3.5 mmol/L (ref 3.5–5.1)
Sodium: 133 mmol/L — ABNORMAL LOW (ref 135–145)

## 2021-04-08 LAB — GLUCOSE, CAPILLARY
Glucose-Capillary: 282 mg/dL — ABNORMAL HIGH (ref 70–99)
Glucose-Capillary: 270 mg/dL — ABNORMAL HIGH (ref 70–99)
Glucose-Capillary: 272 mg/dL — ABNORMAL HIGH (ref 70–99)

## 2021-04-08 LAB — CBC
HCT: 38.1 % — ABNORMAL LOW (ref 39.0–52.0)
Hemoglobin: 12.8 g/dL — ABNORMAL LOW (ref 13.0–17.0)
MCH: 29.5 pg (ref 26.0–34.0)
MCHC: 33.6 g/dL (ref 30.0–36.0)
MCV: 87.8 fL (ref 80.0–100.0)
Platelets: 244 10*3/uL (ref 150–400)
RBC: 4.34 MIL/uL (ref 4.22–5.81)
RDW: 11.9 % (ref 11.5–15.5)
WBC: 9.1 10*3/uL (ref 4.0–10.5)
nRBC: 0 % (ref 0.0–0.2)

## 2021-04-08 LAB — BLOOD GAS, VENOUS
Acid-Base Excess: 0.5 mmol/L (ref 0.0–2.0)
Bicarbonate: 24.6 mmol/L (ref 20.0–28.0)
O2 Saturation: 83.8 %
Patient temperature: 37
pCO2, Ven: 37 mmHg — ABNORMAL LOW (ref 44–60)
pH, Ven: 7.43 (ref 7.25–7.43)
pO2, Ven: 51 mmHg — ABNORMAL HIGH (ref 32–45)

## 2021-04-08 MED ORDER — INSULIN LISPRO (1 UNIT DIAL) 100 UNIT/ML (KWIKPEN): 7.0000 [IU] | PEN_INJECTOR | Freq: Three times a day (TID) | SUBCUTANEOUS | 1 refills | Status: DC

## 2021-04-08 MED ORDER — NOVOLOG 70/30 FLEXPEN RELION (70-30) 100 UNIT/ML ~~LOC~~ SUPN: 35.0000 [IU] | PEN_INJECTOR | Freq: Two times a day (BID) | SUBCUTANEOUS | 1 refills | Status: DC

## 2021-04-08 MED ORDER — BASAGLAR KWIKPEN 100 UNIT/ML ~~LOC~~ SOPN: 50.0000 [IU] | PEN_INJECTOR | Freq: Every day | SUBCUTANEOUS | 1 refills | Status: DC

## 2021-04-08 NOTE — TOC Transition Note (Addendum)
Transition of Care (TOC) - CM/SW Discharge Note ? ? ?Patient Details  ?Name: Tony Malone ?MRN: [id number] ?Date of Birth: 2000-05-25 ? ?Transition of Care (TOC) CM/SW Contact:  ?Bing Quarry, RN ?Phone Number: ?04/08/2021, 9:31 AM ? ? ?Clinical Narrative:  3/19: DC to home/self care today on Xarelto for pulmonary embolism, htx of DKA, and multiple issues.  ?Diabetes coordinator saw patient this am. Orvil Feil RX card and Xarelto coupon.  ?RN CM had delivered Medication Management meds,ordered by provider to patient's room Friday. Gabriel Cirri RN CM  ? ?Update: The insulin pen cannot be located on either unit (ICU or 1C). This RN CM contacted RN CM from Friday at home and she indicated bad was closed with no sticky note on it. ICU RN indicated she would go over new glucometer per RN CM of Friday. This ICU RN is not working and unavailable to check on possible locations. Refrigerators checked by current Unit RN and Consulting civil engineer contacted by provider and this RN CM: it is also not in their patient medication refrigerator and she does not have the RN that worked on Friday's number to contact. Provider aware during all this exchange.  ?Spoke with patient and he has never done either vials or pen.  ?Relayed to provider and unit RN as he will need teaching prior to discharge for safe discharge as was taught only insulin pen use by diabetes coordinator.  ?Discharging with 70/30 from [employer] instead of pen.  ?Gabriel Cirri RN CM  ? ? ? ? ?Final next level of care: Home/Self Care ?Barriers to Discharge: Barriers Resolved ? ? ?Patient Goals and CMS Choice ?Patient states their goals for this hospitalization and ongoing recovery are:: will need financial assistance ?  ?Choice offered to / list presented to : NA ? ?Discharge Placement ?  ?           ?  ?  ?  ?  ? ?Discharge Plan and Services ?  ?Discharge Planning Services: CM Consult, [facility], Medication Assistance ?           ?DME Arranged: N/A ?DME Agency:  NA ?  ?  ?  ?HH Arranged: NA ?HH Agency: NA ?  ?  ?  ? ?Social Determinants of Health (SDOH) Interventions ?  ? ? ?Readmission Risk Interventions ?No flowsheet data found. ? ? ? ? ?

## 2021-04-08 NOTE — Progress Notes (Signed)
Inpatient Diabetes Program Recommendations ? ?AACE/ADA: New Consensus Statement on Inpatient Glycemic Control (2015) ? ?Target Ranges:  Prepandial:   less than 140 mg/dL ?     Peak postprandial:   less than 180 mg/dL (1-2 hours) ?     Critically ill patients:  140 - 180 mg/dL  ? ?Lab Results  ?Component Value Date  ? GLUCAP 270 (H) 04/08/2021  ? HGBA1C 11.5 (H) 04/05/2021  ? ? ?Review of Glycemic Control ? Latest Reference Range & Units 04/07/21 11:12 04/07/21 16:47 04/07/21 21:50 04/08/21 02:40 04/08/21 07:44  ?Glucose-Capillary 70 - 99 mg/dL 546 (H) 270 (H) 350 (H) 282 (H) 270 (H)  ?(H): Data is abnormally high ? ?Diabetes history: DM 2- New diagnosis ?Outpatient Diabetes medications: None ?Current orders for Inpatient glycemic control:  ?Novolog moderate tid with meals and HS ?Novolog 6 units tid with meals ?Semglee 45 units qhs ? ?Inpatient Diabetes Program Recommendations:   ?Please consider: ?-Increase Semglee to 50 qhs ?-Increase Novolog meal coverage to 10 units tid if eats 50% ? ?Thank you, ?Billy Fischer Nickson Middlesworth, RN, MSN, CDE  ?Diabetes Coordinator ?Inpatient Glycemic Control Team ?Team Pager [phone] (8am-5pm) ?04/08/2021 9:32 AM ? ? ? ? ?

## 2021-04-08 NOTE — Plan of Care (Signed)
  Problem: Education: Goal: Knowledge of General Education information will improve Description: Including pain rating scale, medication(s)/side effects and non-pharmacologic comfort measures Outcome: Adequate for Discharge   Problem: Health Behavior/Discharge Planning: Goal: Ability to manage health-related needs will improve Outcome: Adequate for Discharge   Problem: Clinical Measurements: Goal: Ability to maintain clinical measurements within normal limits will improve Outcome: Adequate for Discharge Goal: Will remain free from infection Outcome: Adequate for Discharge Goal: Diagnostic test results will improve Outcome: Adequate for Discharge Goal: Respiratory complications will improve Outcome: Adequate for Discharge Goal: Cardiovascular complication will be avoided Outcome: Adequate for Discharge   Problem: Activity: Goal: Risk for activity intolerance will decrease Outcome: Adequate for Discharge   Problem: Nutrition: Goal: Adequate nutrition will be maintained Outcome: Adequate for Discharge   Problem: Coping: Goal: Level of anxiety will decrease Outcome: Adequate for Discharge   Problem: Elimination: Goal: Will not experience complications related to bowel motility Outcome: Adequate for Discharge Goal: Will not experience complications related to urinary retention Outcome: Adequate for Discharge   Problem: Pain Managment: Goal: General experience of comfort will improve Outcome: Adequate for Discharge   Problem: Safety: Goal: Ability to remain free from injury will improve Outcome: Adequate for Discharge   Problem: Skin Integrity: Goal: Risk for impaired skin integrity will decrease Outcome: Adequate for Discharge   Problem: Education: Goal: Ability to describe self-care measures that may prevent or decrease complications (Diabetes Survival Skills Education) will improve Outcome: Adequate for Discharge Goal: Individualized Educational Video(s) Outcome:  Adequate for Discharge   Problem: Cardiac: Goal: Ability to maintain an adequate cardiac output will improve Outcome: Adequate for Discharge   Problem: Health Behavior/Discharge Planning: Goal: Ability to identify and utilize available resources and services will improve Outcome: Adequate for Discharge Goal: Ability to manage health-related needs will improve Outcome: Adequate for Discharge   Problem: Fluid Volume: Goal: Ability to achieve a balanced intake and output will improve Outcome: Adequate for Discharge   Problem: Metabolic: Goal: Ability to maintain appropriate glucose levels will improve Outcome: Adequate for Discharge   Problem: Nutritional: Goal: Maintenance of adequate nutrition will improve Outcome: Adequate for Discharge Goal: Maintenance of adequate weight for body size and type will improve Outcome: Adequate for Discharge   Problem: Respiratory: Goal: Will regain and/or maintain adequate ventilation Outcome: Adequate for Discharge   Problem: Urinary Elimination: Goal: Ability to achieve and maintain adequate renal perfusion and functioning will improve Outcome: Adequate for Discharge   

## 2021-04-08 NOTE — Progress Notes (Signed)
Unable to locate the insulin previously filled by Medication Management on Friday, 04/06/2021 and given to the ICU nurse, Macky Lower RN, to place in the refrigerator; insulin was not transferred to 1C on Saturday, 04/07/2021 either; Dr Renae Gloss escribed Novolog 70/30 quick pen to the [facility] in [place], [place] in place of these insulins ?

## 2021-04-08 NOTE — Progress Notes (Signed)
MD order received in [facility] to discharge pt home today; verbally reviewed AVS with pt including Rx for Novolog 70/30 pen escribed to [facility] in [place], [place]; pt previously received from Medication Management Diabetic supplies including glucometer, lancets, strips and insulin pen needles; Xarelto starter medication kit; gave pt Xarelto coupon and Good Rx card previously delivered from [facility]; pt to schedule follow up appointments on Monday, 04/09/2021 for the times specified on the AVS; no further questions voiced by the pt at this time; gave pt back to work letter from Dr Leslye Peer for his employer; no questions voiced at this time; pt discharged via wheelchair by this nurse to the medical mall entrance ?

## 2021-04-08 NOTE — Progress Notes (Signed)
?   04/08/21 0029  ?Assess: MEWS Score  ?Temp 98.5 ?F (36.9 ?C)  ?BP 132/76  ?Pulse Rate (!) 122  ?ECG Heart Rate (!) 123  ?Resp (!) 41  ?Level of Consciousness Alert  ?SpO2 97 %  ?O2 Device Room Air  ?Assess: MEWS Score  ?MEWS Temp 0  ?MEWS Systolic 0  ?MEWS Pulse 2  ?MEWS RR 3  ?MEWS LOC 0  ?MEWS Score 5  ?MEWS Score Color Red  ?Assess: if the MEWS score is Yellow or Red  ?Were vital signs taken at a resting state? Yes  ?Focused Assessment Change from prior assessment (see assessment flowsheet)  ?Does the patient meet 2 or more of the SIRS criteria? Yes  ?Does the patient have a confirmed or suspected source of infection? No  ?MEWS guidelines implemented *See Row Information* Yes  ?Treat  ?MEWS Interventions Escalated (See documentation below)  ?Pain Scale 0-10  ?Pain Score 0  ?Take Vital Signs  ?Increase Vital Sign Frequency  Red: Q 1hr X 4 then Q 4hr X 4, if remains red, continue Q 4hrs  ?Escalate  ?MEWS: Escalate Red: discuss with charge nurse/RN and provider, consider discussing with RRT  ?Notify: Charge Nurse/RN  ?Name of Charge Nurse/RN Notified Annice Pih RN  ?Date Charge Nurse/RN Notified 04/08/21  ?Time Charge Nurse/RN Notified 0045  ?Notify: Provider  ?Provider Name/Title Manuela Schwartz NP  ?Date Provider Notified 04/08/21  ?Time Provider Notified [phone]  ?Notification Type  ?(securechat)  ?Notification Reason Other (Comment) ?(Red MEWS)  ?Notify: Rapid Response  ?Name of Rapid Response RN Notified Erika ICU CN  ?Date Rapid Response Notified 04/08/21  ?Time Rapid Response Notified 0045  ?Document  ?Patient Outcome Other (Comment) ?(Back to a yellow MEWS with no interventions)  ?Progress note created (see row info) Yes  ?Assess: SIRS CRITERIA  ?SIRS Temperature  0  ?SIRS Pulse 1  ?SIRS Respirations  1  ?SIRS WBC 0  ?SIRS Score Sum  2  ? ? ?

## 2021-04-08 NOTE — Progress Notes (Signed)
Patient RR is back up to 36 and sustaining in the high 30's with the O2. Spoke to provider Steward Drone NP, new orders for ABGs and continuous pulse ox monitoring/ Patient is asymptomatic, stated he "I feel fine", and do not appear in distress. Lung sound are diminished throughout. Will continue to monitor.  ?

## 2021-04-08 NOTE — Progress Notes (Signed)
On call provider made aware of the VBG and all labs results. No new orders received at this time. Patient's MEWS is still yellow. Patient denied pain or discomfort. No s/s of distress.  ?

## 2021-04-08 NOTE — Discharge Summary (Signed)
?Physician Discharge Summary ?  ?Patient: Tony Malone MRN: [id number] DOB: 2000-11-22  ?Admit date:     04/04/2021  ?Discharge date: 04/08/21  ?Discharge Physician: Loletha Grayer  ? ?PCP: Patient, No Pcp Per (Inactive)  ? ?Recommendations at discharge:  ? ?[facility]-up [facility] ?Follow-up Dr. Delana Meyer 1 week ?Follow-up hematology ? ?Discharge Diagnoses: ?Principal Problem: ?  Polyuria ?Active Problems: ?  Acute pulmonary embolism with acute cor pulmonale (HCC) ?  DKA (diabetic ketoacidosis) ([place]) ?  Thrombus of right ventricle ?  Hyponatremia ?  Hypokalemia ?  Tachycardia ?  Leg pain ?  Leg swelling ?  Polydipsia ?  SIRS (systemic inflammatory response syndrome) (HCC) ?  SOB (shortness of breath) ?  Leucocytosis ?  Chest pain ?  Morbid obesity with BMI of 50.0-59.9, adult [facility]) ? ? ?Hospital Course: ?The patient was admitted to the hospital on 04/04/2021 and discharged on 04/08/2021.  The patient was initially brought in with diabetic ketoacidosis and started on insulin drip.  A CT scan was done early morning on 04/05/2021 that showed a really large right-sided pulmonary embolism.  The patient was brought to the vascular suite and Dr. Delana Meyer performed thrombolysis of the right pulmonary arteries and mechanical thrombectomy of the right middle lobe pulmonary artery.  The patient still had residual clot.  Pathology is still pending on this clot.  Echocardiogram did show blood clot in the right ventricle.  The patient was on heparin drip most of the hospital course and converted over to Xarelto.  Xarelto was obtained through medication management and he did have a starter pack upon disposition. ? ?For the patient's diabetic ketoacidosis he was on insulin drip and then flipped over to Semglee insulin and short acting insulin.  We will try to obtain medications for medication management and they were delivered but put into a refrigerator but we could not find them at the time of discharge.  I had to prescribe  70/30 insulin into [facility] 35 units twice a day.  (Once he is able to obtain medications for medication management can go back on Basaglar insulin 50 units at night and 6 units of short acting insulin prior to meals) ? ?Assessment and Plan: ?Acute pulmonary embolism with acute cor pulmonale ([place]) ?The patient was brought to the vascular lab on 04/05/2021 for massive right-sided pulmonary embolism.  The patient had pulmonary thrombectomy by Dr. Delana Meyer.  The embolism in the right main pulmonary artery is largely removed but the patient did have residual clot in the right upper and lower lobes.  Clot was sent off for pathology to see if this is chronic thrombus or not. Echocardiogram was a difficult study but did show likely clot in the right ventricle.  Ultrasound the lower extremities negative.  Case discussed with Dr. Lorenso Courier vascular surgery covering for the weekend and was okay with switching over to Xarelto for 04/07/2021.  The patient did well with Xarelto and will discharge home on 04/08/2021. ? ?Thrombus of right ventricle ?Heparin converted over to Xarelto.. ? ?DKA (diabetic ketoacidosis) ([place]) ?Newly diagnosed diabetes mellitus.  Here in the hospital we got him on insulin drip initially and converted over to [facility] insulin and increased up to 50 units daily.  We tried getting medications through medication management on Friday.  The insulin is needed refrigeration and the insulins could not be found at the time of discharge.  I needed to call the diabetes coordinator to set up how to do 70/30 insulin.  We decided on 35  units twice a day I prescribed that into [facility].  (Once he is able to get medications through medication management can switch the 70/30 insulin over to long and short acting insulin.  I plan on sending him home on 50 units of Basaglar insulin and 6 units of short acting insulin prior to meals.) ? ?Hyponatremia ?Secondary to DKA.  Last sodium 133 ? ?Hypokalemia ?Replaced into  the normal range ? ?Morbid obesity with BMI of 50.0-59.9, adult ([place]) ?BMI recorded at 55.02 ? ?Tachycardia ?Patient still tachycardic.  Hesitant on starting medications for this at this point.  Continue to monitor for now.  If still tachycardic at follow-up appointment can potentially start low-dose Toprol-XL. ? ? ? ? ?  ? ? ?Consultants: Vascular surgery ?Procedures performed: Thrombolysis and thrombectomy ?Disposition: Home ?Diet recommendation:  ?Carb modified diet ?DISCHARGE MEDICATION: ?Allergies as of 04/08/2021   ?No Known Allergies ?  ? ?  ?Medication List  ?  ? ?STOP taking these medications   ? ?albuterol 108 (90 Base) MCG/ACT inhaler ?Commonly known as: VENTOLIN HFA ?  ?predniSONE 50 MG tablet ?Commonly known as: DELTASONE ?  ? ?  ? ?TAKE these medications   ? ?blood glucose meter kit and supplies Kit ?Use up to four times daily as directed. ?  ?Comfort EZ Pen Needles 32G X 4 MM Misc ?Generic drug: Insulin Pen Needle ?Use as directed with insulin. ?  ?NovoLOG 70/30 FlexPen (70-30) 100 UNIT/ML FlexPen ?Generic drug: insulin aspart protamine - aspart ?Inject 35 Units into the skin 2 (two) times daily. ?  ?Rightest GM550 Blood Glucose w/Device Kit ?Use as directed. ?  ?Xarelto Starter Pack ?Generic drug: Rivaroxaban Stater Pack (15 mg and 20 mg) ?Follow package directions: Take one 81m tablet by mouth twice a day. On day 22, switch to one 226mtablet once a day. Take with food. ?  ? ?  ? ?  ?  ? ? ?  ?Durable Medical Equipment  ?(From admission, onward)  ?  ? ? ?  ? ?  Start     Ordered  ? 04/06/21 1213  DME Glucometer  Once       ? 04/06/21 1212  ? ?  ?  ? ?  ? ? Follow-up Information   ? ? [facility] up in 2 week(s).   ?Specialty: Primary Care ?Contact information: ?[address] ?[phone] ?  ?  ? ? Schnier, GrDolores LoryMD Follow up in 1 week(s).   ?Specialties: Vascular Surgery, Cardiology, Radiology, Vascular Surgery ?Contact  information: ?[phone] ? ?  ?  ? ? BrCammie SickleMD Follow up in 3 week(s).   ?Specialties: Internal Medicine, Oncology ?Why: hospital follow up for patient with Pulmonary embolism ?Contact information: ?[phone] ? ?  ?  ? ?  ?  ? ?  ? ?Discharge Exam: ?Filed Weights  ? 04/04/21 1344 04/04/21 2145  ?Weight: 131.5 kg (!) 169 kg  ? ?Physical Exam ?HENT:  ?   Head: Normocephalic.  ?   Mouth/Throat:  ?   Pharynx: No oropharyngeal exudate.  ?Eyes:  ?   General: Lids are normal.  ?   Conjunctiva/sclera: Conjunctivae normal.  ?Cardiovascular:  ?   Rate and Rhythm: Regular rhythm. Tachycardia present.  ?   Heart sounds: Normal heart sounds, S1 normal and S2 normal.  ?Pulmonary:  ?  Breath sounds: Examination of the right-lower field reveals decreased breath sounds. Examination of the left-lower field reveals decreased breath sounds. Decreased breath sounds present. No wheezing, rhonchi or rales.  ?Abdominal:  ?   Palpations: Abdomen is soft.  ?   Tenderness: There is no abdominal tenderness.  ?Musculoskeletal:  ?   Right lower leg: Swelling present.  ?   Left lower leg: Swelling present.  ?Skin: ?   General: Skin is warm.  ?   Findings: No rash.  ?Neurological:  ?   Mental Status: He is alert and oriented to person, place, and time.  ?  ? ?Condition at discharge: stable ? ?The results of significant diagnostics from this hospitalization (including imaging, microbiology, ancillary and laboratory) are listed below for reference.  ? ?Imaging Studies: ?DG Chest 2 View ? ?Result Date: 04/04/2021 ?CLINICAL DATA:  Shortness of breath and chest pain. EXAM: CHEST - 2 VIEW COMPARISON:  07/02/2019 FINDINGS: Cardiac silhouette and mediastinal contours are within normal limits. The lungs are clear. No pleural effusion or pneumothorax. No acute skeletal abnormality. IMPRESSION: No active cardiopulmonary disease. Electronically Signed    By: Yvonne Kendall M.D.   On: 04/04/2021 14:45  ? ?CT Angio Chest Pulmonary Embolism (PE) W or WO Contrast ? ?Result Date: 04/05/2021 ?CLINICAL DATA:  Concern for pulmonary embolism.  Positive D-dimer. EXAM: C

## 2021-04-08 NOTE — Progress Notes (Signed)
Patient has orders for O2 if sats are below 95%. Sats did decrease to 93% and 2L O2 applied via [place], which triggered the HR to slow down to 118 and the RR down to 17 and the Sats increased to 97%, ultimately changing the MEWS back to yellow. Red MEWS protocol will be implemented. Patient denied ever being told he has sleep apnea or having any sleep studies done in the past. Patient did acknowledged that he was aware that he still has small blot clot in his lungs and that is why he was put on xarelto. Awaiting a call from the on-call provider to provide updates and obtain potential orders, suggestions, or recommendations. Spoke with RR Nurse and 1C CN and updated on the return to yellow MEWS. Will continue to monitor and endorse.  ? ? ? 04/08/21 0029 04/08/21 0101  ?Assess: MEWS Score  ?Temp 98.5 ?F (36.9 ?C) 98.7 ?F (37.1 ?C)  ?BP 132/76 117/69  ?Pulse Rate (!) 122 (!) 122  ?ECG Heart Rate (!) 123 (!) 122  ?Resp (!) 41 (!) 22  ?Level of Consciousness Alert Alert  ?SpO2  --  93 %  ?O2 Device  --  Room Air  ?Assess: MEWS Score  ?MEWS Temp 0 0  ?MEWS Systolic 0 0  ?MEWS Pulse 2 2  ?MEWS RR 3 1  ?MEWS LOC 0 0  ?MEWS Score 5 3  ?MEWS Score Color Red Yellow  ?Assess: SIRS CRITERIA  ?SIRS Temperature  0 0  ?SIRS Pulse 1 1  ?SIRS Respirations  1 1  ?SIRS WBC 0 0  ?SIRS Score Sum  2 2  ? ? ?

## 2021-04-09 LAB — SURGICAL PATHOLOGY

## 2021-04-16 ENCOUNTER — Encounter (INDEPENDENT_AMBULATORY_CARE_PROVIDER_SITE_OTHER): Payer: Self-pay | Admitting: Vascular Surgery

## 2021-04-18 ENCOUNTER — Telehealth (INDEPENDENT_AMBULATORY_CARE_PROVIDER_SITE_OTHER): Payer: Self-pay | Admitting: Vascular Surgery

## 2021-04-19 ENCOUNTER — Encounter (INDEPENDENT_AMBULATORY_CARE_PROVIDER_SITE_OTHER): Payer: Self-pay | Admitting: Vascular Surgery

## 2021-04-19 NOTE — Telephone Encounter (Signed)
Spoke with pt to r/s post op - he states that he can only come in on Friday or Saturdays due to work. I offered tomorrow 3.31.23 and pt stated that he had to work to make up time. The next time I could schedule was 4.14.23. pt accepted appointment. ?

## 2021-04-23 ENCOUNTER — Encounter (INDEPENDENT_AMBULATORY_CARE_PROVIDER_SITE_OTHER): Payer: Self-pay | Admitting: Vascular Surgery

## 2021-05-02 ENCOUNTER — Encounter: Payer: Self-pay | Admitting: Internal Medicine

## 2021-05-02 ENCOUNTER — Inpatient Hospital Stay: Payer: Self-pay | Attending: Internal Medicine | Admitting: Internal Medicine

## 2021-05-02 ENCOUNTER — Other Ambulatory Visit: Payer: Self-pay

## 2021-05-02 ENCOUNTER — Inpatient Hospital Stay: Payer: Self-pay

## 2021-05-02 VITALS — BP 132/87 | HR 86 | Temp 97.1°F | Ht 69.0 in | Wt 369.6 lb

## 2021-05-02 DIAGNOSIS — E1110 Type 2 diabetes mellitus with ketoacidosis without coma: Secondary | ICD-10-CM | POA: Insufficient documentation

## 2021-05-02 DIAGNOSIS — R200 Anesthesia of skin: Secondary | ICD-10-CM | POA: Insufficient documentation

## 2021-05-02 DIAGNOSIS — Z809 Family history of malignant neoplasm, unspecified: Secondary | ICD-10-CM | POA: Insufficient documentation

## 2021-05-02 DIAGNOSIS — K760 Fatty (change of) liver, not elsewhere classified: Secondary | ICD-10-CM | POA: Insufficient documentation

## 2021-05-02 DIAGNOSIS — I2609 Other pulmonary embolism with acute cor pulmonale: Secondary | ICD-10-CM

## 2021-05-02 DIAGNOSIS — Z7901 Long term (current) use of anticoagulants: Secondary | ICD-10-CM | POA: Insufficient documentation

## 2021-05-02 DIAGNOSIS — Z794 Long term (current) use of insulin: Secondary | ICD-10-CM | POA: Insufficient documentation

## 2021-05-02 LAB — COMPREHENSIVE METABOLIC PANEL
ALT: 26 U/L (ref 0–44)
AST: 20 U/L (ref 15–41)
Albumin: 3.9 g/dL (ref 3.5–5.0)
Alkaline Phosphatase: 62 U/L (ref 38–126)
Anion gap: 9 (ref 5–15)
BUN: 9 mg/dL (ref 6–20)
CO2: 23 mmol/L (ref 22–32)
Calcium: 9.3 mg/dL (ref 8.9–10.3)
Chloride: 102 mmol/L (ref 98–111)
Creatinine, Ser: 0.54 mg/dL — ABNORMAL LOW (ref 0.61–1.24)
GFR, Estimated: >60 mL/min (ref >60)
Glucose, Bld: 129 mg/dL — ABNORMAL HIGH (ref 70–99)
Potassium: 3.7 mmol/L (ref 3.5–5.1)
Sodium: 134 mmol/L — ABNORMAL LOW (ref 135–145)
Total Bilirubin: 0.4 mg/dL (ref 0.3–1.2)
Total Protein: 8.2 g/dL — ABNORMAL HIGH (ref 6.5–8.1)

## 2021-05-02 LAB — CBC WITH DIFFERENTIAL/PLATELET
Abs Immature Granulocytes: 0.03 10*3/uL (ref 0.00–0.07)
Basophils Absolute: 0 10*3/uL (ref 0.0–0.1)
Basophils Relative: 1 %
Eosinophils Absolute: 0.1 10*3/uL (ref 0.0–0.5)
Eosinophils Relative: 2 %
HCT: 43.9 % (ref 39.0–52.0)
Hemoglobin: 14.6 g/dL (ref 13.0–17.0)
Immature Granulocytes: 1 %
Lymphocytes Relative: 40 %
Lymphs Abs: 2.2 10*3/uL (ref 0.7–4.0)
MCH: 29.7 pg (ref 26.0–34.0)
MCHC: 33.3 g/dL (ref 30.0–36.0)
MCV: 89.4 fL (ref 80.0–100.0)
Monocytes Absolute: 0.5 10*3/uL (ref 0.1–1.0)
Monocytes Relative: 10 %
Neutro Abs: 2.5 10*3/uL (ref 1.7–7.7)
Neutrophils Relative %: 46 %
Platelets: 266 10*3/uL (ref 150–400)
RBC: 4.91 MIL/uL (ref 4.22–5.81)
RDW: 12.1 % (ref 11.5–15.5)
WBC: 5.4 10*3/uL (ref 4.0–10.5)
nRBC: 0 % (ref 0.0–0.2)

## 2021-05-02 MED ORDER — RIVAROXABAN 20 MG PO TABS
20.0000 mg | ORAL_TABLET | Freq: Every day | ORAL | 4 refills | Status: DC
Start: 2021-05-02 — End: 2021-06-01
  Filled 2021-05-02: qty 30, 30d supply, fill #0

## 2021-05-02 NOTE — Assessment & Plan Note (Addendum)
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  Patient symptoms resolved.  Discussed importance of compliance with his blood thinners.  Prescription for Xarelto refill. ? ?#Etiology : Unclear of the blood clot unclear; history of obesity..  Recommend hypercoagulable work-up.  Discussed patient will need long-term anticoagulation for a minimum of 1 year; or probably indefinite-given the lack of any provoking factors. ? ?# Right LE numbess-unclear etiology [Doppler end of March 2023 negative]; question diabetes versus others. ? ?#Diabetes/DKA 2023 new diagnosis-recommend continued insulin; follow-up with PCP/difficulty with PCP appointments.  See below ? ?# Social worker referral : Patient does not have insurance; has difficulty in making appointment with PCP.  Will refer to Child psychotherapist. ? ?Thank you Dr. Fonnie Birkenhead for allowing me to participate in the care of your pleasant patient. Please do not hesitate to contact me with questions or concerns in the interim. ? ? ?# DISPOSITION: ?# labs- today- ordered ?# Social worker referral : lack of insurance  ?# follow up in 1 month- MD; no labs- Dr.B ?

## 2021-05-02 NOTE — Progress Notes (Signed)
Pt c/o numbness and some pain at times on the rt leg from knee up to his thigh. ?

## 2021-05-02 NOTE — Progress Notes (Signed)
[place] ?CONSULT NOTE ? ?Patient Care Team: ?Patient, No Pcp Per (Inactive) as PCP - General (General Practice) ? ?CHIEF COMPLAINTS/PURPOSE OF CONSULTATION: DVT/PE ? ?#  IMPRESSION: ?1. Positive for acute PE with CT evidence of right heart straining ?(RV/LV Ratio = 1.2) consistent with at least submassive ?(intermediate risk) PE. The presence of right heart strain has been ?associated with an increased risk of morbidity and mortality. ?2. Fatty liver. ?  ?These results were called by telephone at the time of interpretation ?on 04/05/2021 at 2:25 am to nurse practitioner, Randol Kern, who ?verbally acknowledged these results. ?  ? ?  On: 04/05/2021 02:43 ? ?# RIGHT submassive pulm embolism -heparin s/p evaluation with vascular surgery [Dr.Schneir] status post thrombectomy- on Xarelto ? ?# BI  LE Dopplers- NEGATIVE.  ? ?# DKA [march 2023]; morbid obesity;  ?  ?Oncology History  ? No history exists.  ? ? ? ?HISTORY OF PRESENTING ILLNESS:  ?Tony Malone 21 y.o.  male with a recent diagnosis of right lung submassive PE is here for further recommendations on follow-up. ? ?Patient was recently admitted hospital for worsening chest pain/shortness of breath.  Patient was noted to be diabetic ketoacidosis; new diagnosis.  Given tachycardia patient had a CT chest.  CTA showed right-sided submassive PE.  Patient was started on heparin s/p evaluation with vascular surgery status post thrombectomy.  Patient currently on Xarelto once a day. ? ?With regards risk factors: ?Long distance travel- none ?Immobilization/trauma: none ?Previous history of DVT/PE: none ?Family history: unknown.  ? ? ?Review of Systems  ?Constitutional:  Negative for chills, diaphoresis, fever, malaise/fatigue and weight loss.  ?HENT:  Negative for nosebleeds and sore throat.   ?Eyes:  Negative for double vision.  ?Respiratory:  Negative for cough, hemoptysis, sputum production, shortness of breath and wheezing.   ?Cardiovascular:  Negative  for chest pain, palpitations, orthopnea and leg swelling.  ?Gastrointestinal:  Negative for abdominal pain, blood in stool, constipation, diarrhea, heartburn, melena, nausea and vomiting.  ?Genitourinary:  Negative for dysuria, frequency and urgency.  ?Musculoskeletal:  Negative for back pain and joint pain.  ?Skin: Negative.  Negative for itching and rash.  ?Neurological:  Negative for dizziness, tingling, focal weakness, weakness and headaches.  ?Endo/Heme/Allergies:  Does not bruise/bleed easily.  ?Psychiatric/Behavioral:  Negative for depression. The patient is not nervous/anxious and does not have insomnia.    ? ?MEDICAL HISTORY:  ?Past Medical History:  ?Diagnosis Date  ? Diabetes mellitus without complication ([place])   ? Pulmonary embolism ([place])   ? ? ?SURGICAL HISTORY: ?Past Surgical History:  ?Procedure Laterality Date  ? PULMONARY THROMBECTOMY Right 04/05/2021  ? Procedure: PULMONARY THROMBECTOMY;  Surgeon: Katha Cabal, MD;  Location: [facility];  Service: Cardiovascular;  Laterality: Right;  ? ? ?SOCIAL HISTORY: ?Social History  ? ?Socioeconomic History  ? Marital status: Single  ?  Spouse name: Not on file  ? Number of children: Not on file  ? Years of education: Not on file  ? Highest education level: Not on file  ?Occupational History  ? Not on file  ?Tobacco Use  ? Smoking status: Never  ? Smokeless tobacco: Not on file  ?Vaping Use  ? Vaping Use: Never used  ?Substance and Sexual Activity  ? Alcohol use: Never  ? Drug use: No  ? Sexual activity: Yes  ?Other Topics Concern  ? Not on file  ?Social History Narrative  ? Lives in [place]; no children; debt collector; no smoking; rare alcohol.   ? ?  Social Determinants of Health  ? ?Financial Resource Strain: Not on file  ?Food Insecurity: Not on file  ?Transportation Needs: Not on file  ?Physical Activity: Not on file  ?Stress: Not on file  ?Social Connections: Not on file  ?Intimate Partner Violence: Not on file  ? ? ?FAMILY  HISTORY: ?Family History  ?Problem Relation Age of Onset  ? Alopecia Sister   ? Cancer Paternal Grandmother   ? ? ?ALLERGIES:  has No Known Allergies. ? ?MEDICATIONS:  ?Current Outpatient Medications  ?Medication Sig Dispense Refill  ? blood glucose meter kit and supplies KIT Use up to four times daily as directed. 1 each 0  ? Blood Glucose Monitoring Suppl (RIGHTEST [id number] BLOOD GLUCOSE) w/Device KIT Use as directed. 1 kit 0  ? insulin aspart protamine - aspart (NOVOLOG 70/30 FLEXPEN) (70-30) 100 UNIT/ML FlexPen Inject 35 Units into the skin 2 (two) times daily. 15 mL 1  ? Insulin Pen Needle 32G X 4 MM MISC Use as directed with insulin. 200 each 0  ? rivaroxaban (XARELTO) 20 MG TABS tablet Take 1 tablet (20 mg total) by mouth daily with supper. 30 tablet 4  ? RIVAROXABAN (XARELTO) VTE STARTER PACK (15 & 20 MG) Follow package directions: Take one 64m tablet by mouth twice a day. On day 22, switch to one 225mtablet once a day. Take with food. 51 each 0  ? ?No current facility-administered medications for this visit.  ? ? ?  ? ?PHYSICAL EXAMINATION: ? ?Vitals:  ? 05/02/21 1124  ?BP: 132/87  ?Pulse: 86  ?Temp: (!) 97.1 ?F (36.2 ?C)  ?SpO2: 98%  ? ?Filed Weights  ? 05/02/21 1124  ?Weight: (!) 369 lb 9.6 oz (167.6 kg)  ? ? ?Physical Exam ?Vitals and nursing note reviewed.  ?HENT:  ?   Head: Normocephalic and atraumatic.  ?   Mouth/Throat:  ?   Pharynx: Oropharynx is clear.  ?Eyes:  ?   Extraocular Movements: Extraocular movements intact.  ?   Pupils: Pupils are equal, round, and reactive to light.  ?Cardiovascular:  ?   Rate and Rhythm: Normal rate and regular rhythm.  ?Pulmonary:  ?   Comments: Decreased breath sounds bilaterally.  ?Abdominal:  ?   Palpations: Abdomen is soft.  ?Musculoskeletal:     ?   General: Normal range of motion.  ?   Cervical back: Normal range of motion.  ?Skin: ?   General: Skin is warm.  ?Neurological:  ?   General: No focal deficit present.  ?   Mental Status: He is alert and oriented to  person, place, and time.  ?Psychiatric:     ?   Behavior: Behavior normal.     ?   Judgment: Judgment normal.  ? ? ? ?LABORATORY DATA:  ?I have reviewed the data as listed ?Lab Results  ?Component Value Date  ? WBC 9.1 04/08/2021  ? HGB 12.8 (L) 04/08/2021  ? HCT 38.1 (L) 04/08/2021  ? MCV 87.8 04/08/2021  ? PLT 244 04/08/2021  ? ?Recent Labs  ?  04/04/21 ?1925 04/04/21 ?2158 04/06/21 ?0234 04/07/21 ?0345 04/08/21 ?0225  ?NA  --    < > 133* 133* 133*  ?K  --    < > 3.3* 3.3* 3.5  ?CL  --    < > 104 100 100  ?CO2  --    < > 18* 19* 23  ?GLUCOSE  --    < > 287* 296* 293*  ?BUN  --    < > <  5* 6 8  ?CREATININE  --    < > 0.66 0.74 0.74  ?CALCIUM  --    < > 8.6* 8.6* 8.6*  ?GFRNONAA  --    < > >60 >60 >60  ?PROT 8.9*  --   --   --   --   ?ALBUMIN 3.9  --   --   --   --   ?AST 20  --   --   --   --   ?ALT 39  --   --   --   --   ?ALKPHOS 81  --   --   --   --   ?BILITOT 1.1  --   --   --   --   ?BILIDIR 0.1  --   --   --   --   ?IBILI 1.0*  --   --   --   --   ? < > = values in this interval not displayed.  ? ? ?RADIOGRAPHIC STUDIES: ?I have personally reviewed the radiological images as listed and agreed with the findings in the report. ?DG Chest 2 View ? ?Result Date: 04/04/2021 ?CLINICAL DATA:  Shortness of breath and chest pain. EXAM: CHEST - 2 VIEW COMPARISON:  07/02/2019 FINDINGS: Cardiac silhouette and mediastinal contours are within normal limits. The lungs are clear. No pleural effusion or pneumothorax. No acute skeletal abnormality. IMPRESSION: No active cardiopulmonary disease. Electronically Signed   By: Yvonne Kendall M.D.   On: 04/04/2021 14:45  ? ?CT Angio Chest Pulmonary Embolism (PE) W or WO Contrast ? ?Result Date: 04/05/2021 ?CLINICAL DATA:  Concern for pulmonary embolism.  Positive D-dimer. EXAM: CT ANGIOGRAPHY CHEST WITH CONTRAST TECHNIQUE: Multidetector CT imaging of the chest was performed using the standard protocol during bolus administration of intravenous contrast. Multiplanar CT image  reconstructions and MIPs were obtained to evaluate the vascular anatomy. RADIATION DOSE REDUCTION: This exam was performed according to the departmental dose-optimization program which includes automated exposure control, adjus

## 2021-05-02 NOTE — Addendum Note (Signed)
Addended by: Clydia Llano on: 05/02/2021 12:50 PM ? ? Modules accepted: Orders ? ?

## 2021-05-03 LAB — ANTITHROMBIN III: AntiThromb III Func: 103 % (ref 75–120)

## 2021-05-03 LAB — PROTEIN S, TOTAL: Protein S Ag, Total: 179 % — ABNORMAL HIGH (ref 60–150)

## 2021-05-04 ENCOUNTER — Ambulatory Visit (INDEPENDENT_AMBULATORY_CARE_PROVIDER_SITE_OTHER): Payer: Self-pay | Admitting: Nurse Practitioner

## 2021-05-04 ENCOUNTER — Encounter (INDEPENDENT_AMBULATORY_CARE_PROVIDER_SITE_OTHER): Payer: Self-pay | Admitting: Nurse Practitioner

## 2021-05-04 ENCOUNTER — Other Ambulatory Visit: Payer: Self-pay

## 2021-05-04 VITALS — BP 138/84 | HR 91 | Resp 16 | Wt 369.4 lb

## 2021-05-04 DIAGNOSIS — I513 Intracardiac thrombosis, not elsewhere classified: Secondary | ICD-10-CM

## 2021-05-04 DIAGNOSIS — I2609 Other pulmonary embolism with acute cor pulmonale: Secondary | ICD-10-CM

## 2021-05-04 LAB — ANTIPHOSPHOLIPID SYNDROME PROF
Anticardiolipin IgG: <9 GPL U/mL (ref 0–14)
Anticardiolipin IgM: 23 MPL U/mL — ABNORMAL HIGH (ref 0–12)
DRVVT: 111.3 s — ABNORMAL HIGH (ref 0.0–47.0)
PTT Lupus Anticoagulant: 39.2 s (ref 0.0–43.5)

## 2021-05-04 LAB — DRVVT CONFIRM: dRVVT Confirm: 1.8 ratio — ABNORMAL HIGH (ref 0.8–1.2)

## 2021-05-04 LAB — PROTEIN C, TOTAL: Protein C, Total: 86 % (ref 60–150)

## 2021-05-04 LAB — BETA-2-GLYCOPROTEIN I ABS, IGG/M/A
Beta-2 Glyco I IgG: <9 GPI IgG units (ref 0–20)
Beta-2-Glycoprotein I IgA: <9 GPI IgA units (ref 0–25)
Beta-2-Glycoprotein I IgM: <9 GPI IgM units (ref 0–32)

## 2021-05-04 LAB — DRVVT MIX: dRVVT Mix: 78.9 s — ABNORMAL HIGH (ref 0.0–40.4)

## 2021-05-04 MED ORDER — OMEPRAZOLE 40 MG PO CPDR
40.0000 mg | DELAYED_RELEASE_CAPSULE | Freq: Every day | ORAL | 3 refills | Status: AC
Start: 2021-05-04 — End: ?
  Filled 2021-05-04: qty 30, 30d supply, fill #0

## 2021-05-07 ENCOUNTER — Other Ambulatory Visit: Payer: Self-pay

## 2021-05-07 LAB — FACTOR 5 LEIDEN

## 2021-05-07 LAB — PROTHROMBIN GENE MUTATION

## 2021-05-10 ENCOUNTER — Encounter: Payer: Self-pay | Admitting: Licensed Clinical Social Worker

## 2021-05-10 NOTE — Progress Notes (Signed)
[facility] CSW Progress Note ? ?Clinical Social Worker  spoke with patient  to update him on request from financial navigator.  CSW updated patient that hsi voicemail is full and patient stated he was aware.  CSW gave patient financial navigator contact information and updated financial navigator on conversation with patient. Patient was busy ans unable to speak for a long time. Patient stated he would contact CSW when he was able. ? ? ?Tony Malone , LCSW ?

## 2021-05-10 NOTE — Progress Notes (Signed)
[facility] ?Clinical Social Work ? ?Clinical Social Work was referred by medical provider for assessment of psychosocial needs.  Clinical Social Worker contacted patient by phone  to offer support and assess for needs.  CSW was unable to leave voicemail, patient's voicemail is full. ? ? ? ? ?Shekela Goodridge, LCSW  ?Clinical Social Worker ?[facility] ?      ? ?

## 2021-05-20 ENCOUNTER — Encounter (INDEPENDENT_AMBULATORY_CARE_PROVIDER_SITE_OTHER): Payer: Self-pay | Admitting: Nurse Practitioner

## 2021-05-20 NOTE — Progress Notes (Signed)
? ?Subjective:  ? ? Patient ID: JAG LENZ, male    DOB: February 16, 2000, 21 y.o.   MRN: [id number] ?Chief Complaint  ?Patient presents with  ? Follow-up  ?  [facility] follow up  ? ? ?Tony Malone is a 21 year old male that presents today following pulmonary thrombectomy.  The patient was acutely ill prior to intervention and he notes that his symptoms are much improved now.  He is able to breathe much better.  He also had a right ventricular thrombus noted on echocardiogram.  He has been on Xarelto and has not missed any doses.  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 ? ? ?Review of Systems  ?Respiratory:  Negative for shortness of breath.   ?Cardiovascular:  Negative for chest pain and leg swelling.  ?All other systems reviewed and are negative. ? ?   ?Objective:  ? Physical Exam ?Vitals reviewed.  ?HENT:  ?   Head: Normocephalic.  ?Cardiovascular:  ?   Rate and Rhythm: Normal rate.  ?   Pulses: Normal pulses.  ?Pulmonary:  ?   Effort: Pulmonary effort is normal.  ?Skin: ?   General: Skin is warm and dry.  ?Neurological:  ?   Mental Status: He is alert and oriented to person, place, and time.  ?Psychiatric:     ?   Mood and Affect: Mood normal.     ?   Behavior: Behavior normal.     ?   Thought Content: Thought content normal.     ?   Judgment: Judgment normal.  ? ? ?BP 138/84 (BP Location: Right Arm)   Pulse 91   Resp 16   Wt (!) 369 lb 6.4 oz (167.6 kg)   BMI 54.55 kg/m?  ? ?Past Medical History:  ?Diagnosis Date  ? Diabetes mellitus without complication ([place])   ? Pulmonary embolism ([place])   ? ? ?Social History  ? ?Socioeconomic History  ? Marital status: Single  ?  Spouse name: Not on file  ? Number of children: Not on file  ? Years of education: Not on file  ? Highest education level: Not on file  ?Occupational History  ? Not on file  ?Tobacco Use  ? Smoking status: Never  ? Smokeless tobacco: Not on file  ?Vaping Use  ? Vaping Use: Never used  ?Substance and Sexual Activity  ? Alcohol use:  Never  ? Drug use: No  ? Sexual activity: Yes  ?Other Topics Concern  ? Not on file  ?Social History Narrative  ? Lives in [place]; no children; debt collector; no smoking; rare alcohol.   ? ?Social Determinants of Health  ? ?Financial Resource Strain: Not on file  ?Food Insecurity: Not on file  ?Transportation Needs: Not on file  ?Physical Activity: Not on file  ?Stress: Not on file  ?Social Connections: Not on file  ?Intimate Partner Violence: Not on file  ? ? ?Past Surgical History:  ?Procedure Laterality Date  ? PULMONARY THROMBECTOMY Right 04/05/2021  ? Procedure: PULMONARY THROMBECTOMY;  Surgeon: Katha Cabal, MD;  Location: [facility];  Service: Cardiovascular;  Laterality: Right;  ? ? ?Family History  ?Problem Relation Age of Onset  ? Alopecia Sister   ? Cancer Paternal Grandmother   ? ? ?No Known Allergies ? ? ?  Latest Ref Rng & Units 05/02/2021  ? 12:50 PM 04/08/2021  ?  2:25 AM 04/07/2021  ?  3:45 AM  ?CBC  ?WBC 4.0 - 10.5 K/uL 5.4  9.1   9.8    ?Hemoglobin 13.0 - 17.0 g/dL 14.6   12.8   13.5    ?Hematocrit 39.0 - 52.0 % 43.9   38.1   40.3    ?Platelets 150 - 400 K/uL 266   244   205    ? ? ? ? ?CMP  ?   ?Component Value Date/Time  ? NA 134 (L) 05/02/2021 1250  ? NA 138 06/09/2013 1006  ? K 3.7 05/02/2021 1250  ? K 3.9 06/09/2013 1006  ? CL 102 05/02/2021 1250  ? CL 105 06/09/2013 1006  ? CO2 23 05/02/2021 1250  ? CO2 28 (H) 06/09/2013 1006  ? GLUCOSE 129 (H) 05/02/2021 1250  ? GLUCOSE 108 (H) 06/09/2013 1006  ? BUN 9 05/02/2021 1250  ? BUN 10 06/09/2013 1006  ? CREATININE 0.54 (L) 05/02/2021 1250  ? CREATININE 0.68 06/09/2013 1006  ? CALCIUM 9.3 05/02/2021 1250  ? CALCIUM 9.4 06/09/2013 1006  ? PROT 8.2 (H) 05/02/2021 1250  ? ALBUMIN 3.9 05/02/2021 1250  ? AST 20 05/02/2021 1250  ? ALT 26 05/02/2021 1250  ? ALKPHOS 62 05/02/2021 1250  ? BILITOT 0.4 05/02/2021 1250  ? GFRNONAA >60 05/02/2021 1250  ? GFRAA >60 07/02/2019 1029  ? ? ? ?No results found. ? ?   ?Assessment & Plan:  ? ?1. 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  Patient has no further indication for intervention.  Patient should remain on Xarelto for at least 1 year however the patient will be meeting with oncology for possible work-up of hypercoagulable disorders.  Depending on the results of this the patient may need lifelong anticoagulation. ? ?2. Thrombus of right ventricle ?We will send the patient to cardiology to evaluate status of the thrombus in the right ventricle. ?- Ambulatory referral to Cardiology ? ? ?Current Outpatient Medications on File Prior to Visit  ?Medication Sig Dispense Refill  ? blood glucose meter kit and supplies KIT Use up to four times daily as directed. 1 each 0  ? Blood Glucose Monitoring Suppl (RIGHTEST [id number] BLOOD GLUCOSE) w/Device KIT Use as directed. 1 kit 0  ? Insulin Glargine (BASAGLAR KWIKPEN) 100 UNIT/ML Inject 40 Units into the skin daily.    ? Insulin Lispro (HUMALOG KWIKPEN [place]) Inject 6 Units into the skin with breakfast, with lunch, and with evening meal.    ? Insulin Pen Needle 32G X 4 MM MISC Use as directed with insulin. 200 each 0  ? rivaroxaban (XARELTO) 20 MG TABS tablet Take 1 tablet (20 mg total) by mouth once daily with supper. 30 tablet 4  ? RIVAROXABAN (XARELTO) VTE STARTER PACK (15 & 20 MG) Follow package directions: Take one 14m tablet by mouth twice a day. On day 22, switch to one 279mtablet once a day. Take with food. 51 each 0  ? insulin aspart protamine - aspart (NOVOLOG 70/30 FLEXPEN) (70-30) 100 UNIT/ML FlexPen Inject 35 Units into the skin 2 (two) times daily. (Patient not taking: Reported on 05/04/2021) 15 mL 1  ? ?No current facility-administered medications on file prior to visit.  ? ? ?There are no Patient Instructions on file for this visit. ?No follow-ups on file. ? ? ?FaKris HartmannNP ? ? ?

## 2021-05-31 ENCOUNTER — Other Ambulatory Visit: Payer: Self-pay

## 2021-05-31 ENCOUNTER — Telehealth: Payer: Self-pay | Admitting: Pharmacy Technician

## 2021-05-31 NOTE — Telephone Encounter (Signed)
Patient only signed DOH Attestation.  Would need to provide current year's household income if PAP medications were needed. ? ?Uliana Brinker J. Lashannon Bresnan ?Patient Advocate Specialist ?[facility]  ?

## 2021-06-01 ENCOUNTER — Encounter: Payer: Self-pay | Admitting: Internal Medicine

## 2021-06-01 ENCOUNTER — Encounter: Payer: Self-pay | Admitting: Licensed Clinical Social Worker

## 2021-06-01 ENCOUNTER — Inpatient Hospital Stay: Payer: Self-pay | Attending: Internal Medicine | Admitting: Internal Medicine

## 2021-06-01 VITALS — BP 114/79 | HR 102 | Temp 98.4°F | Ht 69.0 in | Wt 356.6 lb

## 2021-06-01 DIAGNOSIS — K760 Fatty (change of) liver, not elsewhere classified: Secondary | ICD-10-CM | POA: Insufficient documentation

## 2021-06-01 DIAGNOSIS — Z7901 Long term (current) use of anticoagulants: Secondary | ICD-10-CM | POA: Insufficient documentation

## 2021-06-01 DIAGNOSIS — Z86711 Personal history of pulmonary embolism: Secondary | ICD-10-CM | POA: Insufficient documentation

## 2021-06-01 DIAGNOSIS — I2609 Other pulmonary embolism with acute cor pulmonale: Secondary | ICD-10-CM

## 2021-06-01 DIAGNOSIS — E119 Type 2 diabetes mellitus without complications: Secondary | ICD-10-CM | POA: Insufficient documentation

## 2021-06-01 MED ORDER — RIVAROXABAN 20 MG PO TABS: 20.0000 mg | ORAL_TABLET | Freq: Every day | ORAL | 4 refills | Status: DC

## 2021-06-01 NOTE — Progress Notes (Signed)
[facility] ?CONSULT NOTE ? ?Patient Care Team: ?Patient, No Pcp Per (Inactive) as PCP - General (General Practice) ? ?CHIEF COMPLAINTS/PURPOSE OF CONSULTATION: DVT/PE ? ?#  IMPRESSION: ?1. Positive for acute PE with CT evidence of right heart straining ?(RV/LV Ratio = 1.2) consistent with at least submassive ?(intermediate risk) PE. The presence of right heart strain has been ?associated with an increased risk of morbidity and mortality. ?2. Fatty liver. ?  ?These results were called by telephone at the time of interpretation ?on 04/05/2021 at 2:25 am to nurse practitioner, Randol Kern, who ?verbally acknowledged these results. ?  ? ?  On: 04/05/2021 02:43 ? ?# RIGHT submassive pulm embolism -heparin s/p evaluation with vascular surgery [Dr.Schneir] status post thrombectomy- on Xarelto; MARCH 2023-hypercoagulable work-up-unremarkable protein C&S normal prothrombin gene; factor V Leiden; Antithrombin III; abnormal DRVVT [on Xarelto could be false positive]; IgM anticardiolipin slightly positive; beta-2 glycoprotein negative.  ? ?# BI  LE Dopplers- NEGATIVE.  ? ?# DKA [march 2023]; morbid obesity;  ?  ?Oncology History  ? No history exists.  ? ? ? ?HISTORY OF PRESENTING ILLNESS: Ambulating independently.  Accompanied by family. ? ?Tony Malone 21 y.o.  male recent history of right lung submassive PE on Xarelto is here for follow-up. ? ?Patient denies any bleeding.  Denies any clots.  Patient states to be compliant with his Xarelto. ? ?The patient unfortunately, has not followed up with PCP yet.  He states that he is currently on insulin.  However sugars at times have been in the 80s.  He has been plan to lose weight. ? ?Review of Systems  ?Constitutional:  Negative for chills, diaphoresis, fever, malaise/fatigue and weight loss.  ?HENT:  Negative for nosebleeds and sore throat.   ?Eyes:  Negative for double vision.  ?Respiratory:  Negative for cough, hemoptysis, sputum production, shortness of breath  and wheezing.   ?Cardiovascular:  Negative for chest pain, palpitations, orthopnea and leg swelling.  ?Gastrointestinal:  Negative for abdominal pain, blood in stool, constipation, diarrhea, heartburn, melena, nausea and vomiting.  ?Genitourinary:  Negative for dysuria, frequency and urgency.  ?Musculoskeletal:  Negative for back pain and joint pain.  ?Skin: Negative.  Negative for itching and rash.  ?Neurological:  Positive for tingling. Negative for dizziness, focal weakness, weakness and headaches.  ?Endo/Heme/Allergies:  Does not bruise/bleed easily.  ?Psychiatric/Behavioral:  Negative for depression. The patient is not nervous/anxious and does not have insomnia.    ? ?MEDICAL HISTORY:  ?Past Medical History:  ?Diagnosis Date  ? Diabetes mellitus without complication ([place])   ? Pulmonary embolism ([place])   ? ? ?SURGICAL HISTORY: ?Past Surgical History:  ?Procedure Laterality Date  ? PULMONARY THROMBECTOMY Right 04/05/2021  ? Procedure: PULMONARY THROMBECTOMY;  Surgeon: Katha Cabal, MD;  Location: [facility];  Service: Cardiovascular;  Laterality: Right;  ? ? ?SOCIAL HISTORY: ?Social History  ? ?Socioeconomic History  ? Marital status: Single  ?  Spouse name: Not on file  ? Number of children: Not on file  ? Years of education: Not on file  ? Highest education level: Not on file  ?Occupational History  ? Not on file  ?Tobacco Use  ? Smoking status: Never  ? Smokeless tobacco: Not on file  ?Vaping Use  ? Vaping Use: Never used  ?Substance and Sexual Activity  ? Alcohol use: Never  ? Drug use: No  ? Sexual activity: Yes  ?Other Topics Concern  ? Not on file  ?Social History Narrative  ? Lives  in [place]; no children; debt collector; no smoking; rare alcohol.   ? ?Social Determinants of Health  ? ?Financial Resource Strain: Not on file  ?Food Insecurity: Not on file  ?Transportation Needs: Not on file  ?Physical Activity: Not on file  ?Stress: Not on file  ?Social Connections: Not on file  ?Intimate  Partner Violence: Not on file  ? ? ?FAMILY HISTORY: ?Family History  ?Problem Relation Age of Onset  ? Alopecia Sister   ? Cancer Paternal Grandmother   ? ? ?ALLERGIES:  has No Known Allergies. ? ?MEDICATIONS:  ?Current Outpatient Medications  ?Medication Sig Dispense Refill  ? blood glucose meter kit and supplies KIT Use up to four times daily as directed. 1 each 0  ? Blood Glucose Monitoring Suppl (RIGHTEST [id number] BLOOD GLUCOSE) w/Device KIT Use as directed. 1 kit 0  ? insulin aspart protamine - aspart (NOVOLOG 70/30 FLEXPEN) (70-30) 100 UNIT/ML FlexPen Inject 35 Units into the skin 2 (two) times daily. 15 mL 1  ? Insulin Pen Needle 32G X 4 MM MISC Use as directed with insulin. 200 each 0  ? Insulin Glargine (BASAGLAR KWIKPEN) 100 UNIT/ML Inject 40 Units into the skin daily. (Patient not taking: Reported on 06/01/2021)    ? Insulin Lispro (HUMALOG KWIKPEN ) Inject 6 Units into the skin with breakfast, with lunch, and with evening meal. (Patient not taking: Reported on 06/01/2021)    ? omeprazole (PRILOSEC) 40 MG capsule Take 1 capsule (40 mg total) by mouth once daily. (Patient not taking: Reported on 06/01/2021) 30 capsule 3  ? rivaroxaban (XARELTO) 20 MG TABS tablet Take 1 tablet (20 mg total) by mouth once daily with supper. 30 tablet 4  ? ?No current facility-administered medications for this visit.  ? ? ?  ? ?PHYSICAL EXAMINATION: ? ?Vitals:  ? 06/01/21 1007  ?BP: 114/79  ?Pulse: (!) 102  ?Temp: 98.4 ?F (36.9 ?C)  ?SpO2: 96%  ? ?Filed Weights  ? 06/01/21 1007  ?Weight: (!) 356 lb 9.6 oz (161.8 kg)  ? ? ?Physical Exam ?Vitals and nursing note reviewed.  ?HENT:  ?   Head: Normocephalic and atraumatic.  ?   Mouth/Throat:  ?   Pharynx: Oropharynx is clear.  ?Eyes:  ?   Extraocular Movements: Extraocular movements intact.  ?   Pupils: Pupils are equal, round, and reactive to light.  ?Cardiovascular:  ?   Rate and Rhythm: Normal rate and regular rhythm.  ?Pulmonary:  ?   Comments: Decreased breath sounds bilaterally.   ?Abdominal:  ?   Palpations: Abdomen is soft.  ?Musculoskeletal:     ?   General: Normal range of motion.  ?   Cervical back: Normal range of motion.  ?Skin: ?   General: Skin is warm.  ?Neurological:  ?   General: No focal deficit present.  ?   Mental Status: He is alert and oriented to person, place, and time.  ?Psychiatric:     ?   Behavior: Behavior normal.     ?   Judgment: Judgment normal.  ? ? ? ?LABORATORY DATA:  ?I have reviewed the data as listed ?Lab Results  ?Component Value Date  ? WBC 5.4 05/02/2021  ? HGB 14.6 05/02/2021  ? HCT 43.9 05/02/2021  ? MCV 89.4 05/02/2021  ? PLT 266 05/02/2021  ? ?Recent Labs  ?  04/04/21 ?1925 04/04/21 ?2158 04/07/21 ?0345 04/08/21 ?0225 05/02/21 ?1250  ?NA  --    < > 133* 133* 134*  ?K  --    < >  3.3* 3.5 3.7  ?CL  --    < > 100 100 102  ?CO2  --    < > 19* 23 23  ?GLUCOSE  --    < > 296* 293* 129*  ?BUN  --    < > _0 ?CREATININE  --    < > 0.74 0.74 0.54*  ?CALCIUM  --    < > 8.6* 8.6* 9.3  ?GFRNONAA  --    < > >60 >60 >60  ?PROT 8.9*  --   --   --  8.2*  ?ALBUMIN 3.9  --   --   --  3.9  ?AST 20  --   --   --  20  ?ALT 39  --   --   --  26  ?ALKPHOS 81  --   --   --  62  ?BILITOT 1.1  --   --   --  0.4  ?BILIDIR 0.1  --   --   --   --   ?IBILI 1.0*  --   --   --   --   ? < > = values in this interval not displayed.  ? ? ?RADIOGRAPHIC STUDIES: ?I have personally reviewed the radiological images as listed and agreed with the findings in the report. ?No results found. ? ?ASSESSMENT & PLAN:  ? ?Acute pulmonary embolism with acute cor pulmonale (H[facility]) ?#Submassive PE right-sided acute [facility] 16th, 2023]-status post thrombectomy; currently on Xarelto.  Patient symptoms resolved.  Discussed importance of compliance with  Xarelto.  ? ?#Etiology: Unclear of the blood clot unclear; history of obesity. hypercoagulable work-up-overall unremarkable except for abnormal lupus anticoagulant/however patient not Xarelto.  Discussed patient will need long-term anticoagulation for a  minimum of 1 year.  However we will plan repeating APS antibodies in 3 months.  Will nee lupus anticoagulant work-up off Xarelto [? In 6 months] .for now continue Xarelto. ? ?# Right LE numbess-unclear et

## 2021-06-01 NOTE — Assessment & Plan Note (Addendum)
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  Patient symptoms resolved.  Discussed importance of compliance with  Xarelto.  ? ?#Etiology: Unclear of the blood clot unclear; history of obesity. hypercoagulable work-up-overall unremarkable except for abnormal lupus anticoagulant/however patient not Xarelto.  Discussed patient will need long-term anticoagulation for a minimum of 1 year.  However we will plan repeating APS antibodies in 3 months.  Will nee lupus anticoagulant work-up off Xarelto [? In 6 months] .for now continue Xarelto. ? ?# Right LE numbess-unclear etiology [Doppler end of March 2023 negative]-likely diabetes STABLE.  ? ?#Diabetes/DKA 2023 new diagnosis-recommend continued insulin; follow-up with PCP/difficulty with PCP appointments.  Spoke to Child psychotherapist; will check on patient today. ? ?# DISPOSITION: ?# Social worker referral : lack of insurance  ?# follow up in  3 months- MD: labs- bcc/cmp; - Dr.B ?

## 2021-06-01 NOTE — Progress Notes (Signed)
[facility] CSW Progress Note ? ?Clinical Social Worker met with patient to discuss insurance concerns. CSW met with patient and spouse Elease Etienne [phone].  CSW gave patient gave him [facility] and [facility] Application, as well as [facility], and explained to him the process for applying to [employer] and discussed with them the tax information required.  CSW also suggested the patient contact the department of health to discuss different programs they may have to assist insulin dependent patients who do not have insurance.  CSW gave patient card with contact information and encouraged patient to cal if he had any questions. Patient verbalized understanding. ? ? ?Tony Sharron, LCSW  ?

## 2021-06-01 NOTE — Progress Notes (Signed)
Having some rt upper leg to knee numbness and pen prickling sensation. ?

## 2021-06-04 ENCOUNTER — Other Ambulatory Visit: Payer: Self-pay

## 2021-08-31 ENCOUNTER — Inpatient Hospital Stay: Payer: Self-pay | Admitting: Internal Medicine

## 2021-08-31 ENCOUNTER — Inpatient Hospital Stay: Payer: Self-pay

## 2021-09-07 ENCOUNTER — Inpatient Hospital Stay (HOSPITAL_BASED_OUTPATIENT_CLINIC_OR_DEPARTMENT_OTHER): Payer: Self-pay | Admitting: Internal Medicine

## 2021-09-07 ENCOUNTER — Telehealth: Payer: Self-pay

## 2021-09-07 ENCOUNTER — Encounter: Payer: Self-pay | Admitting: Internal Medicine

## 2021-09-07 ENCOUNTER — Inpatient Hospital Stay: Payer: Self-pay | Attending: Internal Medicine

## 2021-09-07 ENCOUNTER — Other Ambulatory Visit (HOSPITAL_COMMUNITY): Payer: Self-pay

## 2021-09-07 VITALS — BP 133/89 | HR 87 | Temp 98.6°F | Ht 69.0 in | Wt 358.4 lb

## 2021-09-07 DIAGNOSIS — Z86711 Personal history of pulmonary embolism: Secondary | ICD-10-CM | POA: Insufficient documentation

## 2021-09-07 DIAGNOSIS — Z794 Long term (current) use of insulin: Secondary | ICD-10-CM | POA: Insufficient documentation

## 2021-09-07 DIAGNOSIS — I2609 Other pulmonary embolism with acute cor pulmonale: Secondary | ICD-10-CM

## 2021-09-07 DIAGNOSIS — D6851 Activated protein C resistance: Secondary | ICD-10-CM | POA: Insufficient documentation

## 2021-09-07 DIAGNOSIS — Z7901 Long term (current) use of anticoagulants: Secondary | ICD-10-CM | POA: Insufficient documentation

## 2021-09-07 DIAGNOSIS — Z79899 Other long term (current) drug therapy: Secondary | ICD-10-CM | POA: Insufficient documentation

## 2021-09-07 DIAGNOSIS — E119 Type 2 diabetes mellitus without complications: Secondary | ICD-10-CM | POA: Insufficient documentation

## 2021-09-07 LAB — COMPREHENSIVE METABOLIC PANEL
ALT: 17 U/L (ref 0–44)
AST: 16 U/L (ref 15–41)
Albumin: 4.1 g/dL (ref 3.5–5.0)
Alkaline Phosphatase: 58 U/L (ref 38–126)
Anion gap: 7 (ref 5–15)
BUN: 10 mg/dL (ref 6–20)
CO2: 25 mmol/L (ref 22–32)
Calcium: 9.2 mg/dL (ref 8.9–10.3)
Chloride: 103 mmol/L (ref 98–111)
Creatinine, Ser: 0.7 mg/dL (ref 0.61–1.24)
GFR, Estimated: >60 mL/min (ref >60)
Glucose, Bld: 113 mg/dL — ABNORMAL HIGH (ref 70–99)
Potassium: 3.9 mmol/L (ref 3.5–5.1)
Sodium: 135 mmol/L (ref 135–145)
Total Bilirubin: 0.8 mg/dL (ref 0.3–1.2)
Total Protein: 8.5 g/dL — ABNORMAL HIGH (ref 6.5–8.1)

## 2021-09-07 LAB — CBC WITH DIFFERENTIAL/PLATELET
Abs Immature Granulocytes: 0.01 10*3/uL (ref 0.00–0.07)
Basophils Absolute: 0 10*3/uL (ref 0.0–0.1)
Basophils Relative: 1 %
Eosinophils Absolute: 0.1 10*3/uL (ref 0.0–0.5)
Eosinophils Relative: 2 %
HCT: 48.5 % (ref 39.0–52.0)
Hemoglobin: 16.4 g/dL (ref 13.0–17.0)
Immature Granulocytes: 0 %
Lymphocytes Relative: 43 %
Lymphs Abs: 2.6 10*3/uL (ref 0.7–4.0)
MCH: 29.7 pg (ref 26.0–34.0)
MCHC: 33.8 g/dL (ref 30.0–36.0)
MCV: 87.7 fL (ref 80.0–100.0)
Monocytes Absolute: 0.6 10*3/uL (ref 0.1–1.0)
Monocytes Relative: 10 %
Neutro Abs: 2.7 10*3/uL (ref 1.7–7.7)
Neutrophils Relative %: 44 %
Platelets: 317 10*3/uL (ref 150–400)
RBC: 5.53 MIL/uL (ref 4.22–5.81)
RDW: 12.6 % (ref 11.5–15.5)
WBC: 6.1 10*3/uL (ref 4.0–10.5)
nRBC: 0 % (ref 0.0–0.2)

## 2021-09-07 NOTE — Telephone Encounter (Signed)
MyChart message sent to patient.

## 2021-09-07 NOTE — Progress Notes (Unsigned)
[place] CONSULT NOTE  Patient Care Team: Patient, No Pcp Per as PCP - General ([place]) Cammie Sickle, MD as Consulting Physician (Oncology)  CHIEF COMPLAINTS/PURPOSE OF CONSULTATION: DVT/PE  #  IMPRESSION: 1. Positive for acute PE with CT evidence of right heart straining (RV/LV Ratio = 1.2) consistent with at least submassive (intermediate risk) PE. The presence of right heart strain has been associated with an increased risk of morbidity and mortality. 2. Fatty liver.   These results were called by telephone at the time of interpretation on 04/05/2021 at 2:25 am to nurse practitioner, Randol Kern, who verbally acknowledged these results.      On: 04/05/2021 02:43  # RIGHT submassive pulm embolism -heparin s/p evaluation with vascular surgery [Dr.Schneir] status post thrombectomy- on Xarelto; MARCH 2023-hypercoagulable work-up-unremarkable protein C&S normal prothrombin gene; factor V Leiden; Antithrombin III; abnormal DRVVT [on Xarelto could be false positive]; IgM anticardiolipin slightly positive; beta-2 glycoprotein negative.   # BI  LE Dopplers- NEGATIVE.   # DKA [march 2023]; morbid obesity;    Oncology History   No history exists.     HISTORY OF PRESENTING ILLNESS: Ambulating independently.  Accompanied by family/cousin.  Tony Malone 21 y.o.  male recent history of right lung submassive PE on Xarelto is here for follow-up.  Patient denies any bleeding.  Denies any clots.  Patient states to be compliant with his Xarelto.  The patient unfortunately, has not followed up with PCP yet.  He states that he is currently on insulin.  However sugars at times have been in the 80s.  He has been plan to lose weight.  Review of Systems  Constitutional:  Negative for chills, diaphoresis, fever, malaise/fatigue and weight loss.  HENT:  Negative for nosebleeds and sore throat.   Eyes:  Negative for double vision.  Respiratory:  Negative for  cough, hemoptysis, sputum production, shortness of breath and wheezing.   Cardiovascular:  Negative for chest pain, palpitations, orthopnea and leg swelling.  Gastrointestinal:  Negative for abdominal pain, blood in stool, constipation, diarrhea, heartburn, melena, nausea and vomiting.  Genitourinary:  Negative for dysuria, frequency and urgency.  Musculoskeletal:  Negative for back pain and joint pain.  Skin: Negative.  Negative for itching and rash.  Neurological:  Positive for tingling. Negative for dizziness, focal weakness, weakness and headaches.  Endo/Heme/Allergies:  Does not bruise/bleed easily.  Psychiatric/Behavioral:  Negative for depression. The patient is not nervous/anxious and does not have insomnia.      MEDICAL HISTORY:  Past Medical History:  Diagnosis Date   Diabetes mellitus without complication ([place])    Pulmonary embolism ([place])     SURGICAL HISTORY: Past Surgical History:  Procedure Laterality Date   PULMONARY THROMBECTOMY Right 04/05/2021   Procedure: PULMONARY THROMBECTOMY;  Surgeon: Katha Cabal, MD;  Location: [place] [facility];  Service: Cardiovascular;  Laterality: Right;    SOCIAL HISTORY: Social History   Socioeconomic History   Marital status: Single    Spouse name: Not on file   Number of children: Not on file   Years of education: Not on file   Highest education level: Not on file  Occupational History   Not on file  Tobacco Use   Smoking status: Never   Smokeless tobacco: Not on file  Vaping Use   Vaping Use: Never used  Substance and Sexual Activity   Alcohol use: Never   Drug use: No   Sexual activity: Yes  Other Topics Concern   Not on  file  Social History Narrative   Lives in [place]; no children; debt collector; no smoking; rare alcohol.    Social Determinants of Health   Financial Resource Strain: Not on file  Food Insecurity: Not on file  Transportation Needs: Not on file  Physical Activity: Not on file   Stress: Not on file  Social Connections: Not on file  Intimate Partner Violence: Not on file    FAMILY HISTORY: Family History  Problem Relation Age of Onset   Alopecia Sister    Cancer Paternal Grandmother     ALLERGIES:  has No Known Allergies.  MEDICATIONS:  Current Outpatient Medications  Medication Sig Dispense Refill   blood glucose meter kit and supplies KIT Use up to four times daily as directed. 1 each 0   Blood Glucose Monitoring Suppl (RIGHTEST GM550 BLOOD GLUCOSE) w/Device KIT Use as directed. 1 kit 0   insulin aspart protamine - aspart (NOVOLOG 70/30 FLEXPEN) (70-30) 100 UNIT/ML FlexPen Inject 35 Units into the skin 2 (two) times daily. 15 mL 1   Insulin Pen Needle 32G X 4 MM MISC Use as directed with insulin. 200 each 0   rivaroxaban (XARELTO) 20 MG TABS tablet Take 1 tablet (20 mg total) by mouth once daily with supper. 30 tablet 4   Insulin Glargine (BASAGLAR KWIKPEN) 100 UNIT/ML Inject 40 Units into the skin daily. (Patient not taking: Reported on 06/01/2021)     Insulin Lispro (HUMALOG KWIKPEN [place]) Inject 6 Units into the skin with breakfast, with lunch, and with evening meal. (Patient not taking: Reported on 06/01/2021)     omeprazole (PRILOSEC) 40 MG capsule Take 1 capsule (40 mg total) by mouth once daily. (Patient not taking: Reported on 06/01/2021) 30 capsule 3   No current facility-administered medications for this visit.       PHYSICAL EXAMINATION:  Vitals:   09/07/21 1440  BP: 133/89  Pulse: 87  Temp: 98.6 F (37 C)  SpO2: 97%    Filed Weights   09/07/21 1440  Weight: (!) 358 lb 6.4 oz (162.6 kg)     Physical Exam Vitals and nursing note reviewed.  HENT:     Head: Normocephalic and atraumatic.     Mouth/Throat:     Pharynx: Oropharynx is clear.  Eyes:     Extraocular Movements: Extraocular movements intact.     Pupils: Pupils are equal, round, and reactive to light.  Cardiovascular:     Rate and Rhythm: Normal rate and regular rhythm.   Pulmonary:     Comments: Decreased breath sounds bilaterally.  Abdominal:     Palpations: Abdomen is soft.  Musculoskeletal:        General: Normal range of motion.     Cervical back: Normal range of motion.  Skin:    General: Skin is warm.  Neurological:     General: No focal deficit present.     Mental Status: He is alert and oriented to person, place, and time.  Psychiatric:        Behavior: Behavior normal.        Judgment: Judgment normal.      LABORATORY DATA:  I have reviewed the data as listed Lab Results  Component Value Date   WBC 6.1 09/07/2021   HGB 16.4 09/07/2021   HCT 48.5 09/07/2021   MCV 87.7 09/07/2021   PLT 317 09/07/2021   Recent Labs    04/04/21 1925 04/04/21 2158 04/08/21 0225 05/02/21 1250 09/07/21 1442  NA  --    < >  133* 134* 135  K  --    < > 3.5 3.7 3.9  CL  --    < > 100 102 103  CO2  --    < > 23 23 25   GLUCOSE  --    < > 293* 129* 113*  BUN  --    < > 8 9 10   CREATININE  --    < > 0.74 0.54* 0.70  CALCIUM  --    < > 8.6* 9.3 9.2  GFRNONAA  --    < > >60 >60 >60  PROT 8.9*  --   --  8.2* 8.5*  ALBUMIN 3.9  --   --  3.9 4.1  AST 20  --   --  20 16  ALT 39  --   --  26 17  ALKPHOS 81  --   --  62 58  BILITOT 1.1  --   --  0.4 0.8  BILIDIR 0.1  --   --   --   --   IBILI 1.0*  --   --   --   --    < > = values in this interval not displayed.    RADIOGRAPHIC STUDIES: I have personally reviewed the radiological images as listed and agreed with the findings in the report. No results found.  ASSESSMENT & PLAN:   Acute pulmonary embolism with acute cor pulmonale (HCC) #Submassive PE right-sided acute [facility] 16th, 2023]-status post thrombectomy; currently on Xarelto.  Patient symptoms resolved.  Discussed importance of compliance with  Xarelto.   #Etiology: Unclear of the blood clot unclear; history of obesity. hypercoagulable work-up-overall unremarkable except for abnormal lupus anticoagulant/however patient not Xarelto.   Discussed patient will need long-term anticoagulation for a minimum of 1 year.  However we will plan repeating APS antibodies in 3 months.  Will nee lupus anticoagulant work-up off Xarelto [? In 6 months] .for now continue Xarelto.  # Right LE numbess-unclear etiology [Doppler end of March 2023 negative]-likely diabetes STABLE.   #Diabetes/DKA 2023 new diagnosis-recommend continued insulin; follow-up with PCP/difficulty with PCP appointments.  Spoke to Education officer, museum; will check on patient today.  # DISPOSITION: # follow up in 6 months- - OX:BDZH- cbc/cmp'Dr.B  All questions were answered. The patient knows to call the clinic with any problems, questions or concerns.    Cammie Sickle, MD 09/07/2021 3:58 PM

## 2021-09-07 NOTE — Assessment & Plan Note (Addendum)
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  Patient symptoms resolved.  Discussed importance of compliance with  Xarelto.   #Etiology: Unclear of the blood clot unclear; history of obesity. hypercoagulable work-up-overall unremarkable except for abnormal lupus anticoagulant/however patient not Xarelto.  Discussed patient will need long-term anticoagulation for a minimum of 1 year.  However we will plan repeating APS antibodies in 3 months.  Will nee lupus anticoagulant work-up off Xarelto [? In 6 months] .for now continue Xarelto.  # Right LE numbess-unclear etiology [Doppler end of March 2023 negative]-likely diabetes STABLE.   #Diabetes/DKA 2023 new diagnosis-recommend continued insulin; follow-up with PCP/difficulty with PCP appointments.  Spoke to Child psychotherapist; will check on patient today.  # DISPOSITION: # follow up in 6 months- - XI:HWTU- cbc/cmp'Dr.B

## 2021-09-07 NOTE — Telephone Encounter (Signed)
Patient is uninsured and needing assistance obtaining Xarelto medication.    Advised by Noralyn Pick, [facility] that patient should contact Xarelto support program line at [phone].  He should be able to initiate an application for assistance via phone.

## 2021-09-07 NOTE — Progress Notes (Unsigned)
Pt states he tried to cut his toenail on the right foot and now has a purple spot on big toe.

## 2021-09-08 LAB — CARDIOLIPIN ANTIBODIES, IGG, IGM, IGA
Anticardiolipin IgA: <9 APL U/mL (ref 0–11)
Anticardiolipin IgG: <9 GPL U/mL (ref 0–14)
Anticardiolipin IgM: 11 MPL U/mL (ref 0–12)

## 2021-09-17 NOTE — Telephone Encounter (Signed)
Patient "read" MyChart message on 09/07/21.

## 2021-11-19 ENCOUNTER — Encounter (INDEPENDENT_AMBULATORY_CARE_PROVIDER_SITE_OTHER): Payer: Self-pay

## 2022-03-08 ENCOUNTER — Encounter: Payer: Self-pay | Admitting: Internal Medicine

## 2022-03-08 ENCOUNTER — Ambulatory Visit: Payer: Self-pay | Admitting: Internal Medicine

## 2022-03-08 ENCOUNTER — Inpatient Hospital Stay (HOSPITAL_BASED_OUTPATIENT_CLINIC_OR_DEPARTMENT_OTHER): Payer: Self-pay | Admitting: Internal Medicine

## 2022-03-08 ENCOUNTER — Inpatient Hospital Stay: Payer: Self-pay | Attending: Internal Medicine

## 2022-03-08 ENCOUNTER — Other Ambulatory Visit: Payer: Self-pay

## 2022-03-08 VITALS — BP 126/78 | HR 87 | Temp 97.8°F | Resp 18 | Wt 342.6 lb

## 2022-03-08 DIAGNOSIS — I2609 Other pulmonary embolism with acute cor pulmonale: Secondary | ICD-10-CM

## 2022-03-08 DIAGNOSIS — Z7901 Long term (current) use of anticoagulants: Secondary | ICD-10-CM | POA: Insufficient documentation

## 2022-03-08 DIAGNOSIS — Z794 Long term (current) use of insulin: Secondary | ICD-10-CM | POA: Insufficient documentation

## 2022-03-08 DIAGNOSIS — E119 Type 2 diabetes mellitus without complications: Secondary | ICD-10-CM | POA: Insufficient documentation

## 2022-03-08 LAB — CBC WITH DIFFERENTIAL/PLATELET
Abs Immature Granulocytes: 0.02 10*3/uL (ref 0.00–0.07)
Basophils Absolute: 0 10*3/uL (ref 0.0–0.1)
Basophils Relative: 1 %
Eosinophils Absolute: 0.5 10*3/uL (ref 0.0–0.5)
Eosinophils Relative: 8 %
HCT: 46.7 % (ref 39.0–52.0)
Hemoglobin: 15.8 g/dL (ref 13.0–17.0)
Immature Granulocytes: 0 %
Lymphocytes Relative: 41 %
Lymphs Abs: 2.6 10*3/uL (ref 0.7–4.0)
MCH: 30.3 pg (ref 26.0–34.0)
MCHC: 33.8 g/dL (ref 30.0–36.0)
MCV: 89.6 fL (ref 80.0–100.0)
Monocytes Absolute: 0.8 10*3/uL (ref 0.1–1.0)
Monocytes Relative: 13 %
Neutro Abs: 2.3 10*3/uL (ref 1.7–7.7)
Neutrophils Relative %: 37 %
Platelets: 271 10*3/uL (ref 150–400)
RBC: 5.21 MIL/uL (ref 4.22–5.81)
RDW: 12.2 % (ref 11.5–15.5)
WBC: 6.2 10*3/uL (ref 4.0–10.5)
nRBC: 0 % (ref 0.0–0.2)

## 2022-03-08 LAB — COMPREHENSIVE METABOLIC PANEL
ALT: 18 U/L (ref 0–44)
AST: 16 U/L (ref 15–41)
Albumin: 3.8 g/dL (ref 3.5–5.0)
Alkaline Phosphatase: 58 U/L (ref 38–126)
Anion gap: 7 (ref 5–15)
BUN: 11 mg/dL (ref 6–20)
CO2: 23 mmol/L (ref 22–32)
Calcium: 8.8 mg/dL — ABNORMAL LOW (ref 8.9–10.3)
Chloride: 104 mmol/L (ref 98–111)
Creatinine, Ser: 0.81 mg/dL (ref 0.61–1.24)
GFR, Estimated: >60 mL/min (ref >60)
Glucose, Bld: 123 mg/dL — ABNORMAL HIGH (ref 70–99)
Potassium: 3.7 mmol/L (ref 3.5–5.1)
Sodium: 134 mmol/L — ABNORMAL LOW (ref 135–145)
Total Bilirubin: 0.5 mg/dL (ref 0.3–1.2)
Total Protein: 7.5 g/dL (ref 6.5–8.1)

## 2022-03-08 NOTE — Patient Instructions (Signed)
#   Stop Xarelto for 1 week.  # Check labs after 1 week  # Restart Xarelto after the labs are done  # Follow-up in the clinic-in about 1 month.

## 2022-03-08 NOTE — Progress Notes (Signed)
[name] CONSULT NOTE  Patient Care Team: Patient, No Pcp Per as PCP - General ([place]) Cammie Sickle, MD as Consulting Physician (Oncology)  CHIEF COMPLAINTS/PURPOSE OF CONSULTATION: DVT/PE  #  IMPRESSION: 1. Positive for acute PE with CT evidence of right heart straining (RV/LV Ratio = 1.2) consistent with at least submassive (intermediate risk) PE. The presence of right heart strain has been associated with an increased risk of morbidity and mortality. 2. Fatty liver.   These results were called by telephone at the time of interpretation on 04/05/2021 at 2:25 am to nurse practitioner, Randol Kern, who verbally acknowledged these results.      On: 04/05/2021 02:43  # RIGHT submassive pulm embolism -heparin s/p evaluation with vascular surgery [Dr.Schneir] status post thrombectomy- on Xarelto; MARCH 2023-hypercoagulable work-up-unremarkable protein C&S normal prothrombin gene; factor V Leiden; Antithrombin III; abnormal DRVVT [on Xarelto could be false positive]; IgM anticardiolipin slightly positive; beta-2 glycoprotein negative.   # BI  LE Dopplers- NEGATIVE.   # DKA [march 2023]; morbid obesity;    Oncology History   No history exists.     HISTORY OF PRESENTING ILLNESS: Ambulating independently.  Alone.   Tony Malone 22 y.o.  male recent history of right lung submassive PE on Xarelto; and DM on insulin  is here for follow-up.  Pt still having pain in his R chest area from Thrombectomy. Worsens if he exerts himself-intermittent.   His R leg is still numb on the outside area of his Thigh. This area also feels like stinging needles.  Chronic not any worse.  No swelling in his R leg.   Denies any visible bleeding from his blood thinner. Patient states to be compliant with his Xarelto.  Patient states his blood sugars are better controlled.   Review of Systems  Constitutional:  Negative for chills, diaphoresis, fever, malaise/fatigue and  weight loss.  HENT:  Negative for nosebleeds and sore throat.   Eyes:  Negative for double vision.  Respiratory:  Negative for cough, hemoptysis, sputum production, shortness of breath and wheezing.   Cardiovascular:  Negative for chest pain, palpitations, orthopnea and leg swelling.  Gastrointestinal:  Negative for abdominal pain, blood in stool, constipation, diarrhea, heartburn, melena, nausea and vomiting.  Genitourinary:  Negative for dysuria, frequency and urgency.  Musculoskeletal:  Negative for back pain and joint pain.  Skin: Negative.  Negative for itching and rash.  Neurological:  Positive for tingling. Negative for dizziness, focal weakness, weakness and headaches.  Endo/Heme/Allergies:  Does not bruise/bleed easily.  Psychiatric/Behavioral:  Negative for depression. The patient is not nervous/anxious and does not have insomnia.      MEDICAL HISTORY:  Past Medical History:  Diagnosis Date   Diabetes mellitus without complication ([place])    Pulmonary embolism ([place])     SURGICAL HISTORY: Past Surgical History:  Procedure Laterality Date   PULMONARY THROMBECTOMY Right 04/05/2021   Procedure: PULMONARY THROMBECTOMY;  Surgeon: Katha Cabal, MD;  Location: [place] [facility];  Service: Cardiovascular;  Laterality: Right;    SOCIAL HISTORY: Social History   Socioeconomic History   Marital status: Single    Spouse name: Not on file   Number of children: Not on file   Years of education: Not on file   Highest education level: Not on file  Occupational History   Not on file  Tobacco Use   Smoking status: Never   Smokeless tobacco: Not on file  Vaping Use   Vaping Use: Never used  Substance and Sexual Activity   Alcohol use: Never   Drug use: No   Sexual activity: Yes  Other Topics Concern   Not on file  Social History Narrative   Lives in [place]; no children; debt collector; no smoking; rare alcohol.    Social Determinants of Health   Financial  Resource Strain: Not on file  Food Insecurity: Not on file  Transportation Needs: Not on file  Physical Activity: Not on file  Stress: Not on file  Social Connections: Not on file  Intimate Partner Violence: Not on file    FAMILY HISTORY: Family History  Problem Relation Age of Onset   Alopecia Sister    Cancer Paternal Grandmother     ALLERGIES:  has No Known Allergies.  MEDICATIONS:  Current Outpatient Medications  Medication Sig Dispense Refill   blood glucose meter kit and supplies KIT Use up to four times daily as directed. 1 each 0   Blood Glucose Monitoring Suppl (RIGHTEST GM550 BLOOD GLUCOSE) w/Device KIT Use as directed. 1 kit 0   Insulin Lispro (HUMALOG KWIKPEN [place]) Inject 6 Units into the skin with breakfast, with lunch, and with evening meal.     Insulin Pen Needle 32G X 4 MM MISC Use as directed with insulin. 200 each 0   omeprazole (PRILOSEC) 40 MG capsule Take 1 capsule (40 mg total) by mouth once daily. 30 capsule 3   rivaroxaban (XARELTO) 20 MG TABS tablet Take 1 tablet (20 mg total) by mouth once daily with supper. 30 tablet 4   No current facility-administered medications for this visit.       PHYSICAL EXAMINATION:  Vitals:   03/08/22 1452  BP: 126/78  Pulse: 87  Resp: 18  Temp: 97.8 F (36.6 C)  SpO2: 96%    Filed Weights   03/08/22 1452  Weight: (!) 342 lb 9.6 oz (155.4 kg)     Physical Exam Vitals and nursing note reviewed.  HENT:     Head: Normocephalic and atraumatic.     Mouth/Throat:     Pharynx: Oropharynx is clear.  Eyes:     Extraocular Movements: Extraocular movements intact.     Pupils: Pupils are equal, round, and reactive to light.  Cardiovascular:     Rate and Rhythm: Normal rate and regular rhythm.  Pulmonary:     Comments: Decreased breath sounds bilaterally.  Abdominal:     Palpations: Abdomen is soft.  Musculoskeletal:        General: Normal range of motion.     Cervical back: Normal range of motion.  Skin:     General: Skin is warm.  Neurological:     General: No focal deficit present.     Mental Status: He is alert and oriented to person, place, and time.  Psychiatric:        Behavior: Behavior normal.        Judgment: Judgment normal.      LABORATORY DATA:  I have reviewed the data as listed Lab Results  Component Value Date   WBC 6.2 03/08/2022   HGB 15.8 03/08/2022   HCT 46.7 03/08/2022   MCV 89.6 03/08/2022   PLT 271 03/08/2022   Recent Labs    04/04/21 1925 04/04/21 2158 05/02/21 1250 09/07/21 1442 03/08/22 1434  NA  --    < > 134* 135 134*  K  --    < > 3.7 3.9 3.7  CL  --    < > 102 103 104  CO2  --    < >  $23 25 23  y$ GLUCOSE  --    < > 129* 113* 123*  BUN  --    < > 9 10 11  $ CREATININE  --    < > 0.54* 0.70 0.81  CALCIUM  --    < > 9.3 9.2 8.8*  GFRNONAA  --    < > >60 >60 >60  PROT 8.9*  --  8.2* 8.5* 7.5  ALBUMIN 3.9  --  3.9 4.1 3.8  AST 20  --  20 16 16  $ ALT 39  --  26 17 18  $ ALKPHOS 81  --  62 58 58  BILITOT 1.1  --  0.4 0.8 0.5  BILIDIR 0.1  --   --   --   --   IBILI 1.0*  --   --   --   --    < > = values in this interval not displayed.    RADIOGRAPHIC STUDIES: I have personally reviewed the radiological images as listed and agreed with the findings in the report. No results found.  ASSESSMENT & PLAN:   Acute pulmonary embolism with acute cor pulmonale (HCC) #Submassive PE right-sided acute [facility] 16th, 2023]-status post thrombectomy; currently on Xarelto.  Patient symptoms resolved.  Discussed importance of compliance with  Xarelto [see below]  #Etiology: Unclear of the blood clot unclear; history of obesity. hypercoagulable work-up-overall unremarkable except for abnormal lupus anticoagulant/however patient not Xarelto.  Recommend holding Xarelto for 1 week; repeat labs-Lupus anticoagulant workup/antibodies.  Patient will go back on his Xarelto after the lab work.  He will follow-up with me 1 month.  Verbal instructions/and written instructions  given.  Patient verbalized understanding of the above plan.  # Right LE numbess-unclear etiology [Doppler end of March 2023 negative]-likely diabetes -stable.    #Diabetes/DKA 2023-continue insulin; follow-up with PCP/difficulty with PCP appointments.    =----------------------------------- # Stop Xarelto for 1 week.  # Check labs after 1 week  # Restart Xarelto after the labs are done  # Follow-up in the clinic-in about 1 month.  ---------------------------  # DISPOSITION: # # Check labs in  1 week- ordered # follow up in 1 month-- - MD; no labs- Dr.B  All questions were answered. The patient knows to call the clinic with any problems, questions or concerns.    Cammie Sickle, MD 03/08/2022 4:02 PM

## 2022-03-08 NOTE — Assessment & Plan Note (Addendum)
#  Submassive PE right-sided acute [facility] 16th, 2023]-status post thrombectomy; currently on Xarelto.  Patient symptoms resolved.  Discussed importance of compliance with  Xarelto [see below]  #Etiology: Unclear of the blood clot unclear; history of obesity. hypercoagulable work-up-overall unremarkable except for abnormal lupus anticoagulant/however patient not Xarelto.  Recommend holding Xarelto for 1 week; repeat labs-Lupus anticoagulant workup/antibodies.  Patient will go back on his Xarelto after the lab work.  He will follow-up with me 1 month.  Verbal instructions/and written instructions given.  Patient verbalized understanding of the above plan.  # Right LE numbess-unclear etiology [Doppler end of March 2023 negative]-likely diabetes -stable.    #Diabetes/DKA 2023-continue insulin; follow-up with PCP/difficulty with PCP appointments.    =----------------------------------- # Stop Xarelto for 1 week.  # Check labs after 1 week  # Restart Xarelto after the labs are done  # Follow-up in the clinic-in about 1 month.  ---------------------------  # DISPOSITION: # # Check labs in  1 week- ordered # follow up in 1 month-- - MD; no labs- Dr.B

## 2022-03-08 NOTE — Progress Notes (Signed)
Pt still having pain in his R chest area from Thrombectomy. Worsens if he exerts himself. Dyspnea with exertion. His R leg is still numb on the outside area of his Thigh. This area also feels like stinging needles. No swelling in his R leg. Denies any visible bleeding from his blood thinner.

## 2022-03-15 ENCOUNTER — Inpatient Hospital Stay: Payer: Self-pay

## 2022-03-18 ENCOUNTER — Inpatient Hospital Stay: Payer: Self-pay

## 2022-03-18 DIAGNOSIS — I2609 Other pulmonary embolism with acute cor pulmonale: Secondary | ICD-10-CM

## 2022-03-18 LAB — D-DIMER, QUANTITATIVE: D-Dimer, Quant: 0.35 ug/mL-FEU (ref 0.00–0.50)

## 2022-03-20 LAB — ANTIPHOSPHOLIPID SYNDROME PROF
Anticardiolipin IgG: <9 GPL U/mL (ref 0–14)
Anticardiolipin IgM: <9 MPL U/mL (ref 0–12)
DRVVT: 38.5 s (ref 0.0–47.0)
PTT Lupus Anticoagulant: 35.1 s (ref 0.0–43.5)

## 2022-03-20 LAB — BETA-2-GLYCOPROTEIN I ABS, IGG/M/A
Beta-2 Glyco I IgG: <9 GPI IgG units (ref 0–20)
Beta-2-Glycoprotein I IgA: <9 GPI IgA units (ref 0–25)
Beta-2-Glycoprotein I IgM: <9 GPI IgM units (ref 0–32)

## 2022-04-08 ENCOUNTER — Inpatient Hospital Stay: Payer: Self-pay | Attending: Internal Medicine | Admitting: Internal Medicine

## 2022-04-08 ENCOUNTER — Encounter: Payer: Self-pay | Admitting: Internal Medicine

## 2022-05-29 ENCOUNTER — Telehealth (INDEPENDENT_AMBULATORY_CARE_PROVIDER_SITE_OTHER): Payer: Self-pay | Admitting: Vascular Surgery

## 2022-05-29 NOTE — Telephone Encounter (Signed)
Spoke with pt in regards to his MyChart message requesting an appt. Through talking with pt, it was determined that he is actually needing an appt with [facility]. Pt stated that he will call the office and get an appt with them. Nothing further needed at this time.

## 2022-07-24 ENCOUNTER — Other Ambulatory Visit: Payer: Self-pay

## 2022-07-24 ENCOUNTER — Other Ambulatory Visit (INDEPENDENT_AMBULATORY_CARE_PROVIDER_SITE_OTHER): Payer: Self-pay | Admitting: Nurse Practitioner

## 2022-07-25 ENCOUNTER — Other Ambulatory Visit: Payer: Self-pay

## 2022-07-29 ENCOUNTER — Other Ambulatory Visit: Payer: Self-pay

## 2022-08-01 ENCOUNTER — Other Ambulatory Visit: Payer: Self-pay

## 2022-08-05 ENCOUNTER — Inpatient Hospital Stay: Payer: Self-pay | Admitting: Internal Medicine

## 2022-08-05 NOTE — Assessment & Plan Note (Deleted)
#  Submassive PE right-sided acute [facility] 16th, 2023]-status post thrombectomy; currently on Xarelto.  Patient symptoms resolved.  Discussed importance of compliance with  Xarelto [see below]  #Etiology: Unclear of the blood clot unclear; history of obesity. hypercoagulable work-up-overall unremarkable except for abnormal lupus anticoagulant/however patient not Xarelto.  Recommend holding Xarelto for 1 week; repeat labs-Lupus anticoagulant workup/antibodies.  Patient will go back on his Xarelto after the lab work.  He will follow-up with me 1 month.  Verbal instructions/and written instructions given.  Patient verbalized understanding of the above plan.  # Right LE numbess-unclear etiology [Doppler end of March 2023 negative]-likely diabetes -stable.    #Diabetes/DKA 2023-continue insulin; follow-up with PCP/difficulty with PCP appointments.    =----------------------------------- # Stop Xarelto for 1 week.  # Check labs after 1 week  # Restart Xarelto after the labs are done  # Follow-up in the clinic-in about 1 month.  ---------------------------  # DISPOSITION: # # Check labs in  1 week- ordered # follow up in 1 month-- - MD; no labs- Dr.B

## 2022-08-13 ENCOUNTER — Inpatient Hospital Stay: Payer: Self-pay | Attending: Internal Medicine | Admitting: Internal Medicine

## 2022-09-02 ENCOUNTER — Encounter: Payer: Self-pay | Admitting: *Deleted

## 2022-09-02 ENCOUNTER — Inpatient Hospital Stay: Payer: Self-pay | Attending: Internal Medicine | Admitting: Internal Medicine

## 2022-09-02 ENCOUNTER — Encounter: Payer: Self-pay | Admitting: Internal Medicine

## 2022-09-02 VITALS — BP 142/89 | HR 97 | Temp 98.0°F | Ht 69.0 in | Wt 342.8 lb

## 2022-09-02 DIAGNOSIS — Z86711 Personal history of pulmonary embolism: Secondary | ICD-10-CM | POA: Insufficient documentation

## 2022-09-02 DIAGNOSIS — Z794 Long term (current) use of insulin: Secondary | ICD-10-CM | POA: Insufficient documentation

## 2022-09-02 DIAGNOSIS — Z7901 Long term (current) use of anticoagulants: Secondary | ICD-10-CM | POA: Insufficient documentation

## 2022-09-02 DIAGNOSIS — I2609 Other pulmonary embolism with acute cor pulmonale: Secondary | ICD-10-CM

## 2022-09-02 DIAGNOSIS — K760 Fatty (change of) liver, not elsewhere classified: Secondary | ICD-10-CM | POA: Insufficient documentation

## 2022-09-02 DIAGNOSIS — Z79899 Other long term (current) drug therapy: Secondary | ICD-10-CM | POA: Insufficient documentation

## 2022-09-02 DIAGNOSIS — E119 Type 2 diabetes mellitus without complications: Secondary | ICD-10-CM | POA: Insufficient documentation

## 2022-09-02 MED ORDER — RIVAROXABAN 20 MG PO TABS: 20.0000 mg | ORAL_TABLET | Freq: Every day | ORAL | 5 refills | Status: AC

## 2022-09-02 NOTE — Progress Notes (Signed)
Needs refills on xarelto, pending. He states he is also needing refills on his 2  insulins, he doesn't know the name of one of his insulins. Looks like Dr. Renae Gloss has been sending in insulin supplies.

## 2022-09-02 NOTE — Progress Notes (Signed)
[facility] CONSULT NOTE  Patient Care Team: Patient, No Pcp Per as PCP - General (General Practice) Earna Coder, MD as Consulting Physician (Oncology)  CHIEF COMPLAINTS/PURPOSE OF CONSULTATION: DVT/PE  #  IMPRESSION: 1. Positive for acute PE with CT evidence of right heart straining (RV/LV Ratio = 1.2) consistent with at least submassive (intermediate risk) PE. The presence of right heart strain has been associated with an increased risk of morbidity and mortality. 2. Fatty liver.   These results were called by telephone at the time of interpretation on 04/05/2021 at 2:25 am to nurse practitioner, Jon Billings, who verbally acknowledged these results.      On: 04/05/2021 02:43  # RIGHT submassive pulm embolism -heparin s/p evaluation with vascular surgery [Dr.Schneir] status post thrombectomy- on Xarelto; MARCH 2023-hypercoagulable work-up-unremarkable protein C&S normal prothrombin gene; factor V Leiden; Antithrombin III; abnormal DRVVT [on Xarelto could be false positive]; IgM anticardiolipin slightly positive; beta-2 glycoprotein negative.   # BI  LE Dopplers- NEGATIVE.   # DKA [march 2023]; morbid obesity;    Oncology History   No history exists.     HISTORY OF PRESENTING ILLNESS: Ambulating independently.  With girl friend.    Tony Malone 22 y.o.  male recent history of right lung submassive PE on Xarelto; and DM on insulin  is here for follow-up.  Patient needs refills on xarelto, pending.   Denies any visible bleeding from his blood thinner. Patient states to be non-compliant with his Xarelto.   He states he is also needing refills on his 2  insulins, he doesn't know the name of one of his insulins.  Patient states his blood sugars are better controlled.   Review of Systems  Constitutional:  Negative for chills, diaphoresis, fever, malaise/fatigue and weight loss.  HENT:  Negative for nosebleeds and sore throat.   Eyes:  Negative for  double vision.  Respiratory:  Negative for cough, hemoptysis, sputum production, shortness of breath and wheezing.   Cardiovascular:  Negative for chest pain, palpitations, orthopnea and leg swelling.  Gastrointestinal:  Negative for abdominal pain, blood in stool, constipation, diarrhea, heartburn, melena, nausea and vomiting.  Genitourinary:  Negative for dysuria, frequency and urgency.  Musculoskeletal:  Negative for back pain and joint pain.  Skin: Negative.  Negative for itching and rash.  Neurological:  Positive for tingling. Negative for dizziness, focal weakness, weakness and headaches.  Endo/Heme/Allergies:  Does not bruise/bleed easily.  Psychiatric/Behavioral:  Negative for depression. The patient is not nervous/anxious and does not have insomnia.      MEDICAL HISTORY:  Past Medical History:  Diagnosis Date   Diabetes mellitus without complication (HCC)    Pulmonary embolism (HCC)     SURGICAL HISTORY: Past Surgical History:  Procedure Laterality Date   PULMONARY THROMBECTOMY Right 04/05/2021   Procedure: PULMONARY THROMBECTOMY;  Surgeon: Renford Dills, MD;  Location: [facility] INVASIVE CV LAB;  Service: Cardiovascular;  Laterality: Right;    SOCIAL HISTORY: Social History   Socioeconomic History   Marital status: Single    Spouse name: Not on file   Number of children: Not on file   Years of education: Not on file   Highest education level: Not on file  Occupational History   Not on file  Tobacco Use   Smoking status: Never   Smokeless tobacco: Not on file  Vaping Use   Vaping status: Never Used  Substance and Sexual Activity   Alcohol use: Never   Drug use: No   Sexual  activity: Yes  Other Topics Concern   Not on file  Social History Narrative   Lives in [place]; no children; debt collector; no smoking; rare alcohol.    Social Determinants of Health   Financial Resource Strain: Not on file  Food Insecurity: Not on file  Transportation Needs: Not  on file  Physical Activity: Not on file  Stress: Not on file  Social Connections: Not on file  Intimate Partner Violence: Not on file    FAMILY HISTORY: Family History  Problem Relation Age of Onset   Alopecia Sister    Cancer Paternal Grandmother     ALLERGIES:  has No Known Allergies.  MEDICATIONS:  Current Outpatient Medications  Medication Sig Dispense Refill   blood glucose meter kit and supplies KIT Use up to four times daily as directed. 1 each 0   Blood Glucose Monitoring Suppl (RIGHTEST GM550 BLOOD GLUCOSE) w/Device KIT Use as directed. 1 kit 0   Insulin Lispro (HUMALOG KWIKPEN [place]) Inject 6 Units into the skin with breakfast, with lunch, and with evening meal.     Insulin Pen Needle 32G X 4 MM MISC Use as directed with insulin. 200 each 0   omeprazole (PRILOSEC) 40 MG capsule Take 1 capsule (40 mg total) by mouth once daily. 30 capsule 3   rivaroxaban (XARELTO) 20 MG TABS tablet Take 1 tablet (20 mg total) by mouth once daily with supper. 30 tablet 5   No current facility-administered medications for this visit.       PHYSICAL EXAMINATION:  Vitals:   09/02/22 1537  BP: (!) 142/89  Pulse: 97  Temp: 98 F (36.7 C)  SpO2: 94%    Filed Weights   09/02/22 1537  Weight: (!) 342 lb 12.8 oz (155.5 kg)     Physical Exam Vitals and nursing note reviewed.  HENT:     Head: Normocephalic and atraumatic.     Mouth/Throat:     Pharynx: Oropharynx is clear.  Eyes:     Extraocular Movements: Extraocular movements intact.     Pupils: Pupils are equal, round, and reactive to light.  Cardiovascular:     Rate and Rhythm: Normal rate and regular rhythm.  Pulmonary:     Comments: Decreased breath sounds bilaterally.  Abdominal:     Palpations: Abdomen is soft.  Musculoskeletal:        General: Normal range of motion.     Cervical back: Normal range of motion.  Skin:    General: Skin is warm.  Neurological:     General: No focal deficit present.     Mental  Status: He is alert and oriented to person, place, and time.  Psychiatric:        Behavior: Behavior normal.        Judgment: Judgment normal.      LABORATORY DATA:  I have reviewed the data as listed Lab Results  Component Value Date   WBC 6.2 03/08/2022   HGB 15.8 03/08/2022   HCT 46.7 03/08/2022   MCV 89.6 03/08/2022   PLT 271 03/08/2022   Recent Labs    09/07/21 1442 03/08/22 1434  NA 135 134*  K 3.9 3.7  CL 103 104  CO2 25 23  GLUCOSE 113* 123*  BUN 10 11  CREATININE 0.70 0.81  CALCIUM 9.2 8.8*  GFRNONAA >60 >60  PROT 8.5* 7.5  ALBUMIN 4.1 3.8  AST 16 16  ALT 17 18  ALKPHOS 58 58  BILITOT 0.8 0.5    RADIOGRAPHIC  STUDIES: I have personally reviewed the radiological images as listed and agreed with the findings in the report. No results found.  ASSESSMENT & PLAN:   Acute pulmonary embolism with acute cor pulmonale (HCC) #Submassive PE right-sided acute [March 16th, 2023]-status post thrombectomy; currently on Xarelto.  Patient symptoms resolved.  Discussed importance of compliance with  Xarelto [see below]  #Etiology: Unclear of the blood clot unclear; history of obesity. hypercoagulable work-up-overall unremarkable except for abnormal lupus anticoagulant/however patient not Xarelto.  REPEAT APS work up-APRIL 2024- NEGATIVE.  Given the morbid obesity; and the unprovoked nature of his blood clot-I would recommend indefinite anticoagulation. Will refill for 6 months.   # Right LE numbess-unclear etiology [Doppler end of March 2023 negative]-likely diabetes -stable.    #Diabetes/DKA 2023-continue insulin; follow-up with PCP/difficulty with PCP appointments.   # I had a long discussion with the patient regarding the risk of bleeding while on anticoagulation.  Patient counseled regarding taking at most care to avoid falls/hitting head. The patient should inform us of any bleeding episodes; or report to emergency room if any significant bleeding is noticed.   #  DISPOSITION: # return PCP re: DM management # follow up in 6  month-- - MD; labs- cbc/cmp; d-dimer- Dr.B  All questions were answered. The patient knows to call the clinic with any problems, questions or concerns.    Earna Coder, MD 09/02/2022 4:01 PM

## 2022-09-02 NOTE — Assessment & Plan Note (Addendum)
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  Patient symptoms resolved.  Discussed importance of compliance with  Xarelto [see below]  #Etiology: Unclear of the blood clot unclear; history of obesity. hypercoagulable work-up-overall unremarkable except for abnormal lupus anticoagulant/however patient not Xarelto.  REPEAT APS work up-APRIL 2024- NEGATIVE.  Given the morbid obesity; and the unprovoked nature of his blood clot-I would recommend indefinite anticoagulation. Will refill for 6 months.   # Right LE numbess-unclear etiology [Doppler end of March 2023 negative]-likely diabetes -stable.    #Diabetes/DKA 2023-continue insulin; follow-up with PCP/difficulty with PCP appointments.   # I had a long discussion with the patient regarding the risk of bleeding while on anticoagulation.  Patient counseled regarding taking at most care to avoid falls/hitting head. The patient should inform us of any bleeding episodes; or report to emergency room if any significant bleeding is noticed.   # DISPOSITION: # return PCP re: DM management # follow up in 6  month-- - MD; labs- cbc/cmp; d-dimer- Dr.B

## 2022-10-04 IMAGING — CT CT ANGIO CHEST
2 of 7 series · 17 of 46 positions shown · IV contrast (APPLIED)
Comparison: Chest radiograph dated 04/04/2021.

CLINICAL DATA: Concern for pulmonary embolism.  Positive D-dimer.

EXAM:
CT ANGIOGRAPHY CHEST WITH CONTRAST
TECHNIQUE: Multidetector CT imaging of the chest was performed using the
standard protocol during bolus administration of intravenous
contrast. Multiplanar CT image reconstructions and MIPs were
obtained to evaluate the vascular anatomy.

[Series 5: thins · axial · 0.84mm/px · z∈[+88,+343]mm · 14 of 354 slices shown]
[im 18/354  lung]
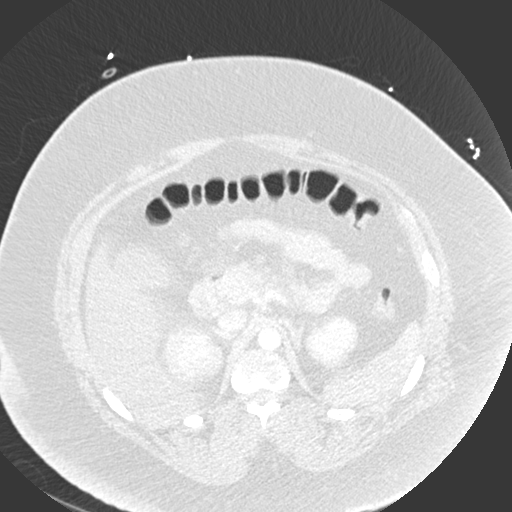
[im 53/354  soft-tissue]
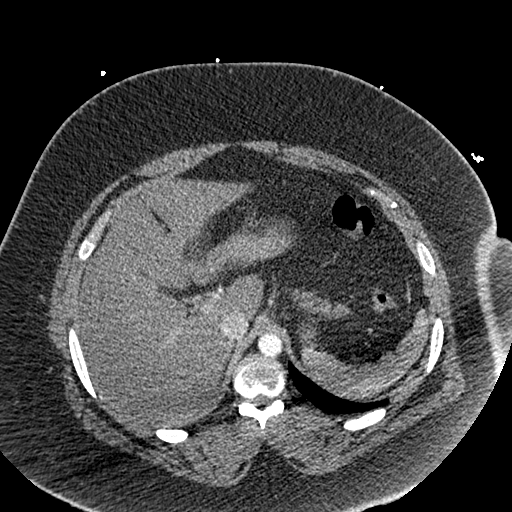
[im 71/354  lung]
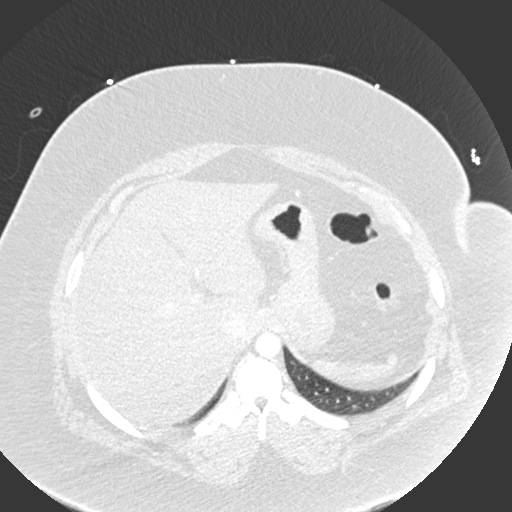
[im 89/354  soft-tissue]
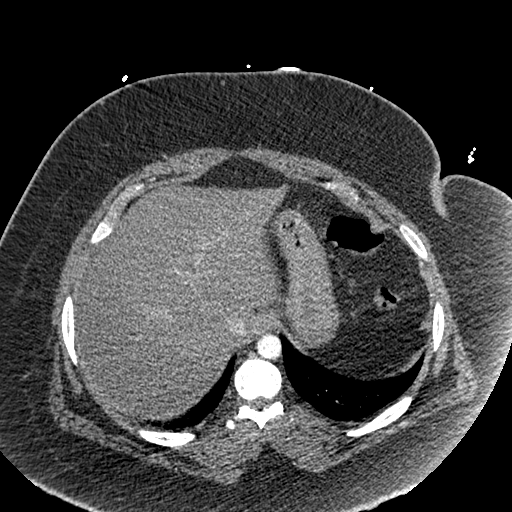
[im 124/354  lung]
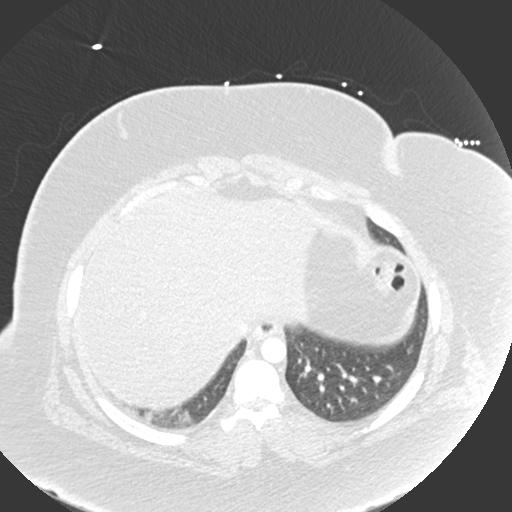
[im 142/354  soft-tissue]
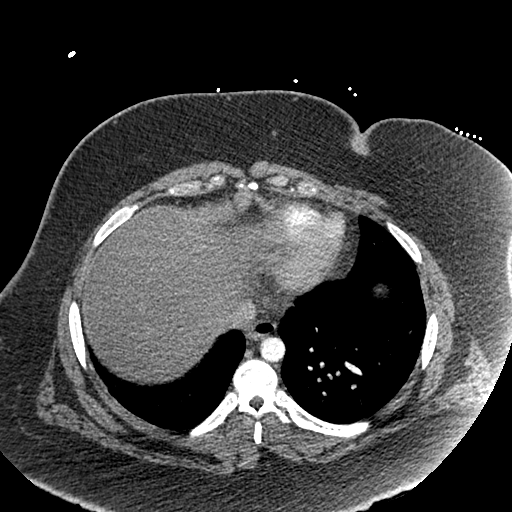
[im 159/354  lung]
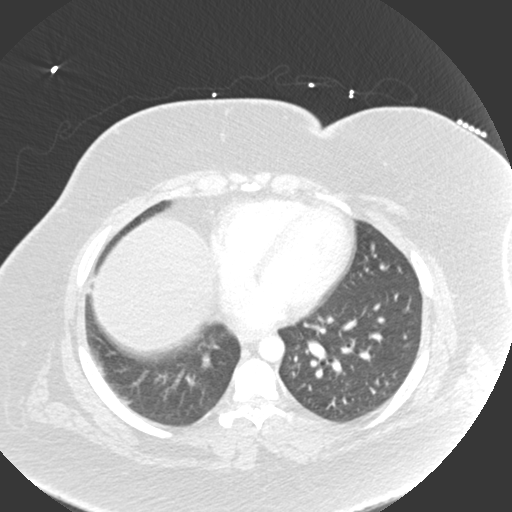
[im 195/354  soft-tissue]
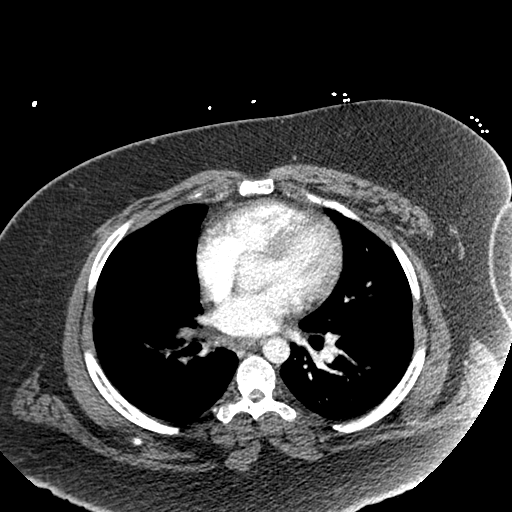
[im 212/354  lung]
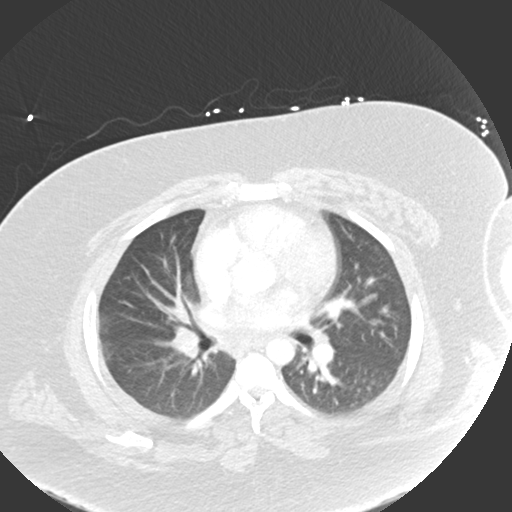
[im 230/354  soft-tissue]
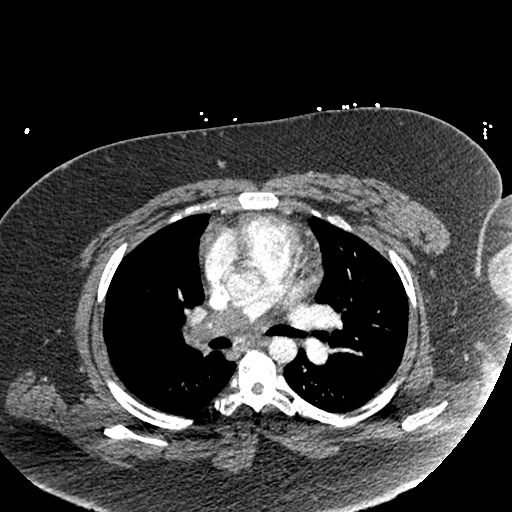
[im 265/354  lung]
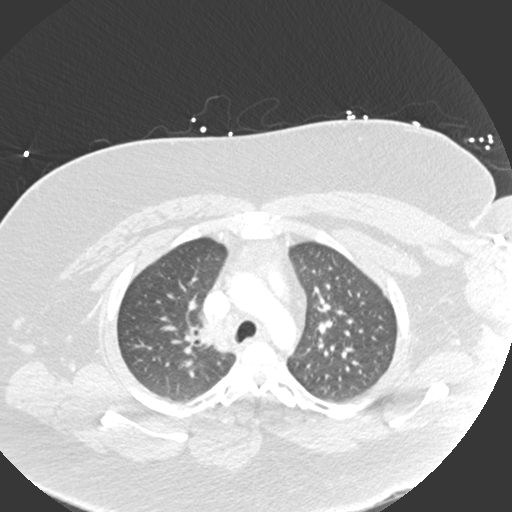
[im 283/354  soft-tissue]
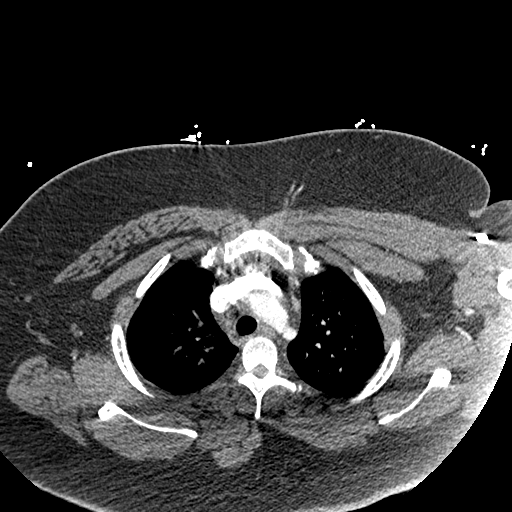
[im 301/354  lung]
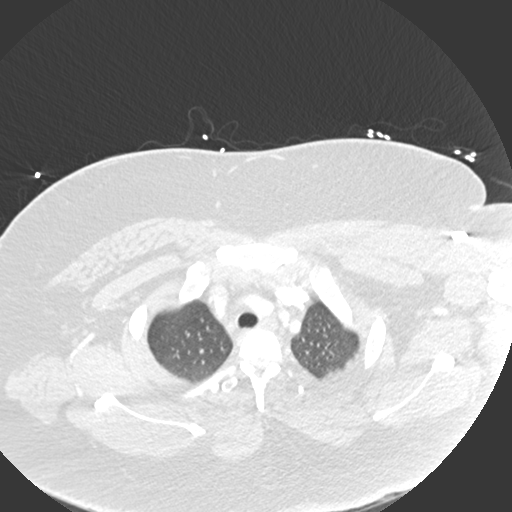
[im 336/354  soft-tissue]
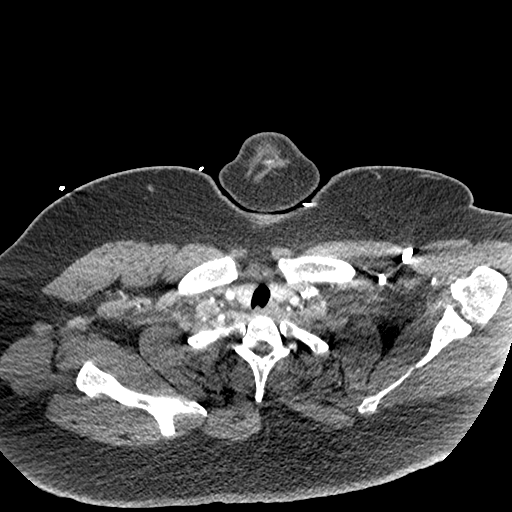

[Series 7: coronal mpr · coronal · 0.55mm/px · 3 of 106 slices shown]
[im 27/106  soft-tissue]
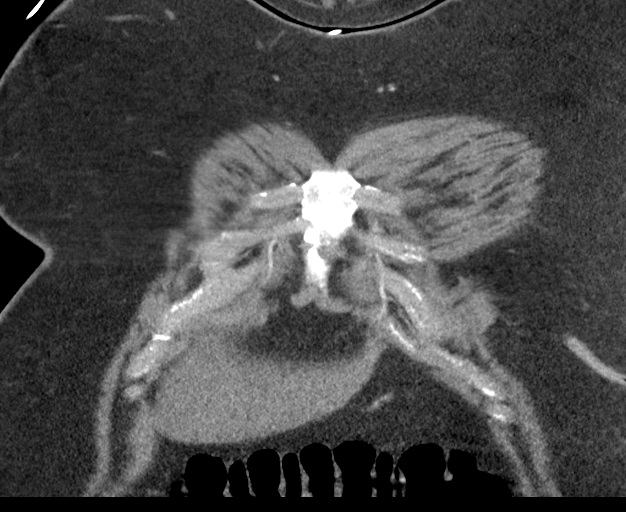
[im 53/106  soft-tissue]
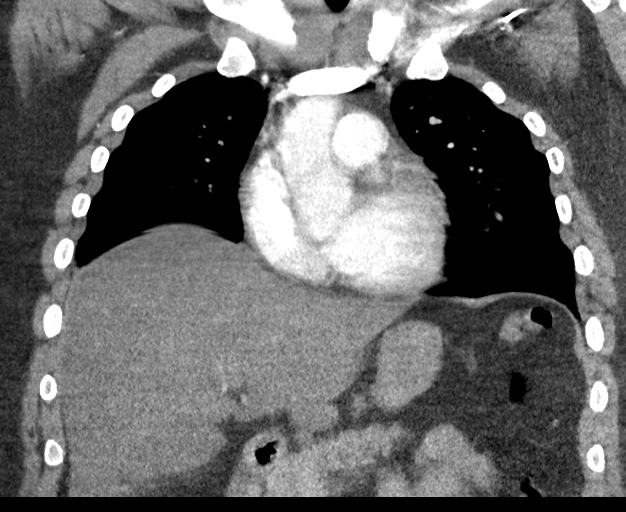
[im 79/106  soft-tissue]
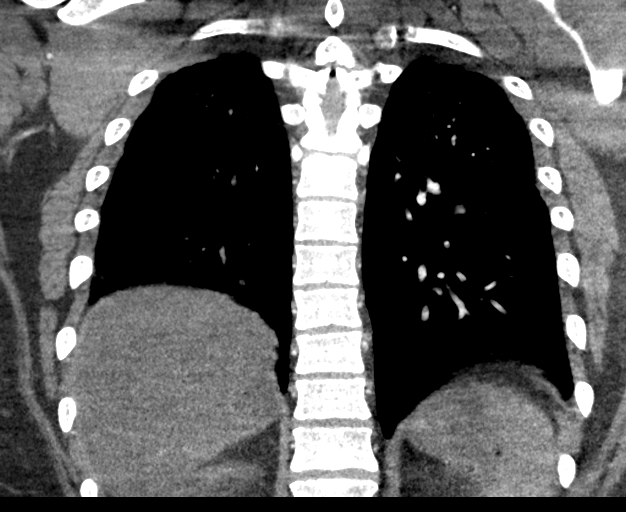

[17 of 46 positions shown; findings below may reference images not displayed]

RADIATION DOSE REDUCTION: This exam was performed according to the
departmental dose-optimization program which includes automated
exposure control, adjustment of the mA and/or kV according to
patient size and/or use of iterative reconstruction technique.

CONTRAST:  100mL OMNIPAQUE IOHEXOL 350 MG/ML SOLN
FINDINGS: Evaluation of this exam is limited due to respiratory motion
artifact as well as streak artifact caused by patient's arms.
Evaluation is also limited due to suboptimal opacification of the
pulmonary artery branches and timing of the contrast.

Cardiovascular: There is no cardiomegaly or pericardial effusion.
The thoracic aorta is unremarkable. The origins of the great vessels
of the aortic arch appear patent as visualized. There is a large
pulmonary artery embolus involving the right main pulmonary artery
extending into the upper and lower lobe lobar branches. Evaluation
is limited due to factors above but the clot appears nearly
occlusive. There is mild dilatation of the right ventricle with
RV/LV ratio of approximately 1.2 consistent with right heart
straining.

Mediastinum/Nodes: No hilar or mediastinal adenopathy. The esophagus
is grossly unremarkable. No mediastinal fluid collection.

Lungs/Pleura: Faint right lung base subpleural ground-glass
densities may represent atelectasis or developing infarct. No focal
consolidation, pleural effusion, or pneumothorax. The central
airways are patent.

Upper Abdomen: Fatty liver.

Musculoskeletal: No chest wall abnormality. No acute or significant
osseous findings.

Review of the MIP images confirms the above findings.
IMPRESSION: 1. Positive for acute PE with CT evidence of right heart straining
(RV/LV Ratio = 1.2) consistent with at least submassive
(intermediate risk) PE. The presence of right heart strain has been
associated with an increased risk of morbidity and mortality.
2. Fatty liver.

These results were called by telephone at the time of interpretation
on 04/05/2021 at [DATE] to nurse practitioner, Ceejay, who
verbally acknowledged these results.

## 2022-10-05 IMAGING — US US EXTREM LOW VENOUS
1 series · 13 of 24 positions shown · non-contrast
Comparison: Chest CTA-04/05/2021

CLINICAL DATA: Pulmonary embolism.  Evaluate for DVT.



[Series 1: us venous img lower bilat (dvt) · portal-venous · 13 of 59 slices shown]
[im 1/59]
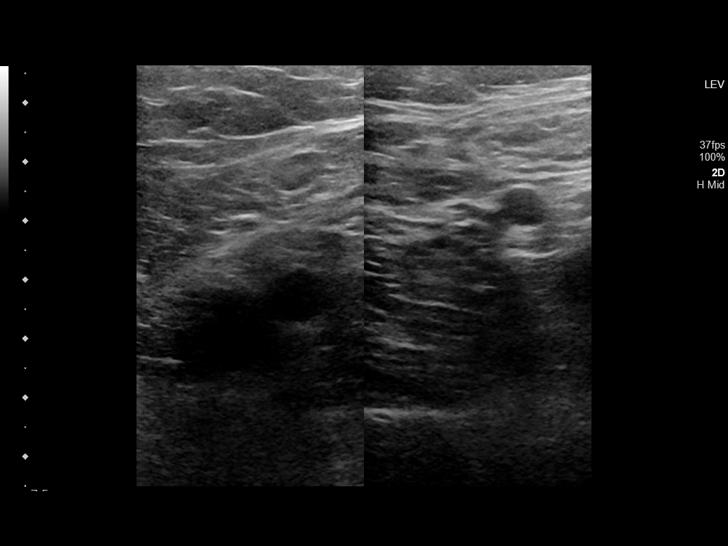
[im 6/59]
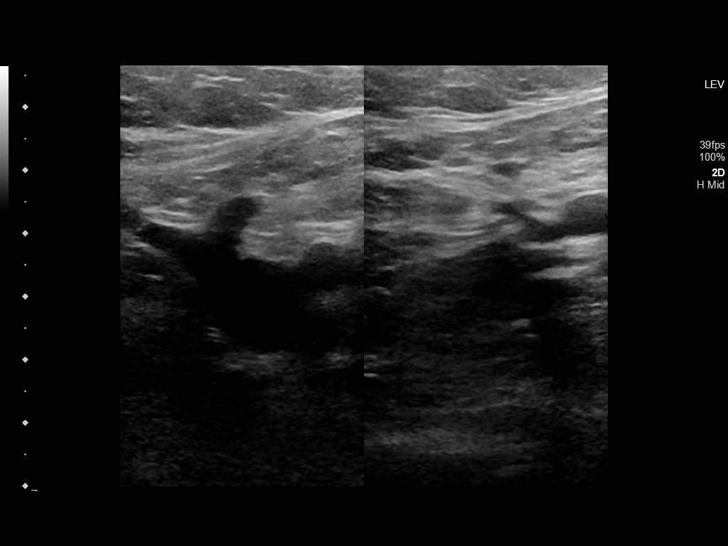
[im 11/59]
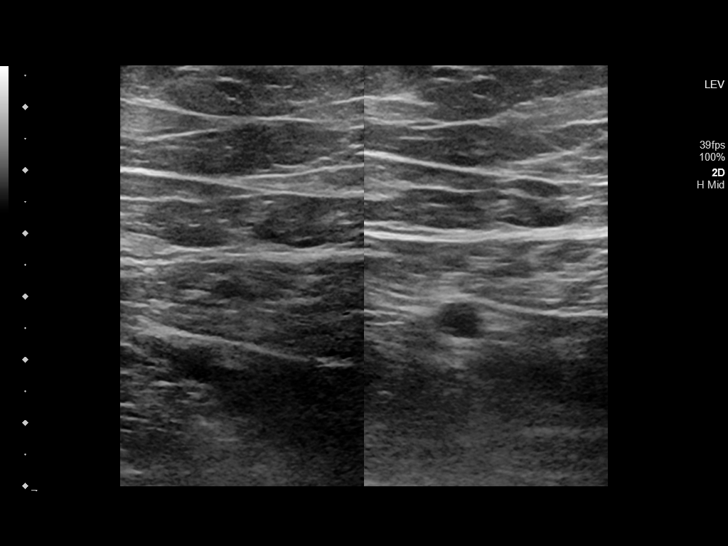
[im 16/59]
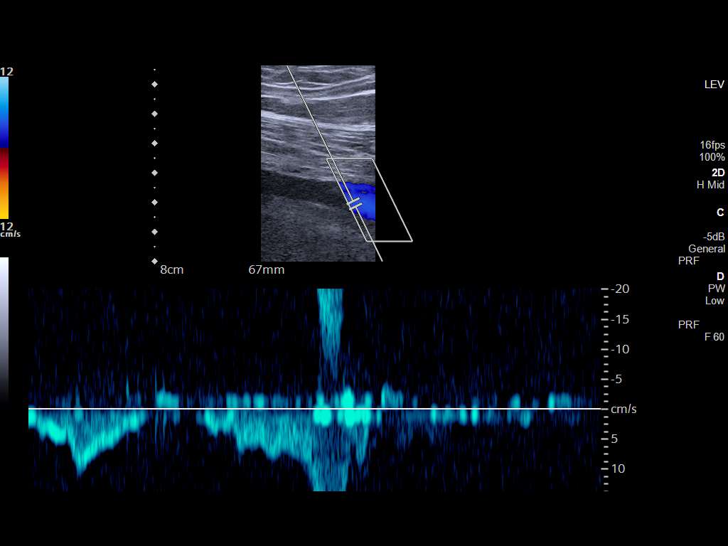
[im 21/59]
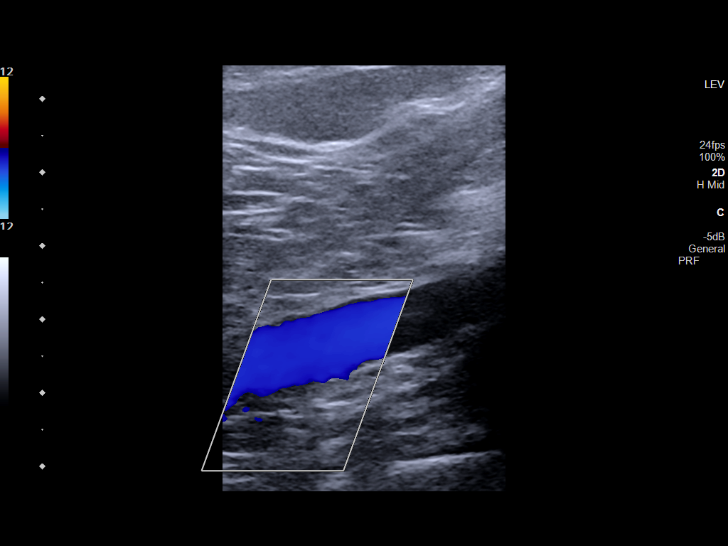
[im 26/59]
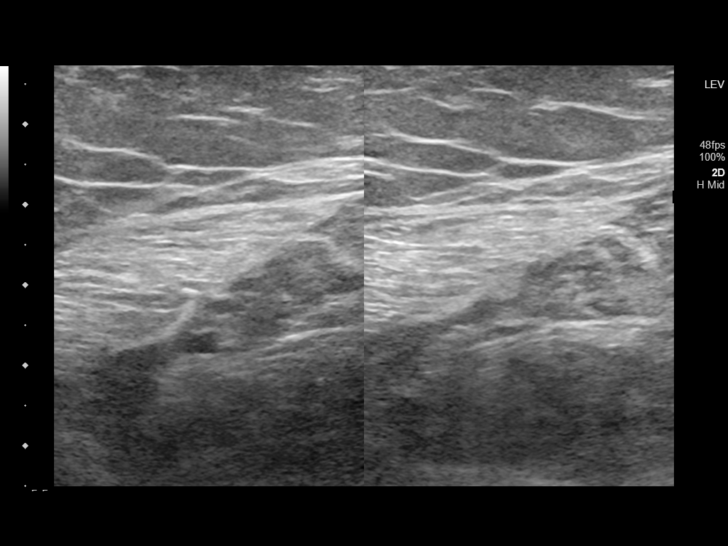
[im 31/59]
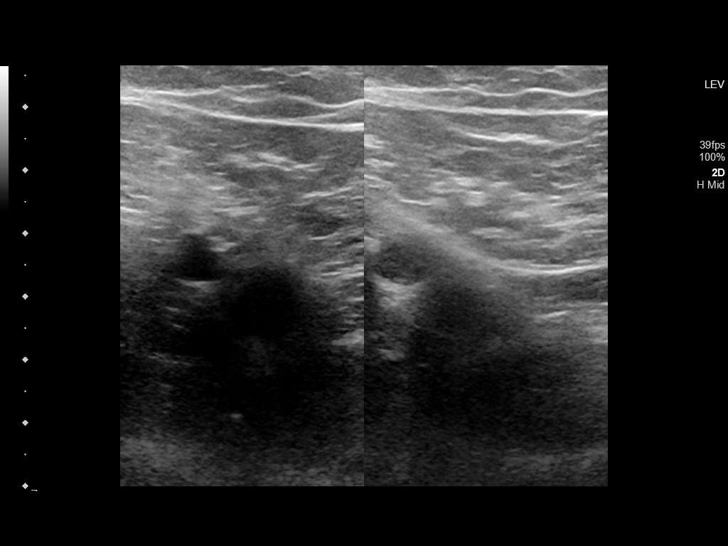
[im 33/59]
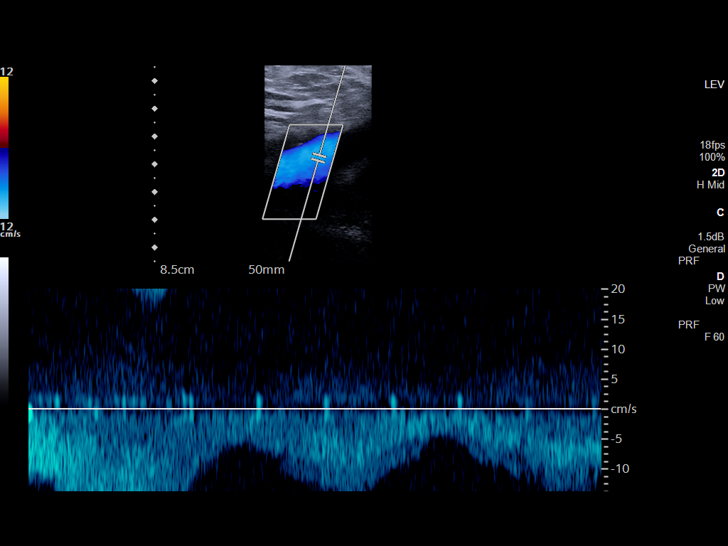
[im 38/59]
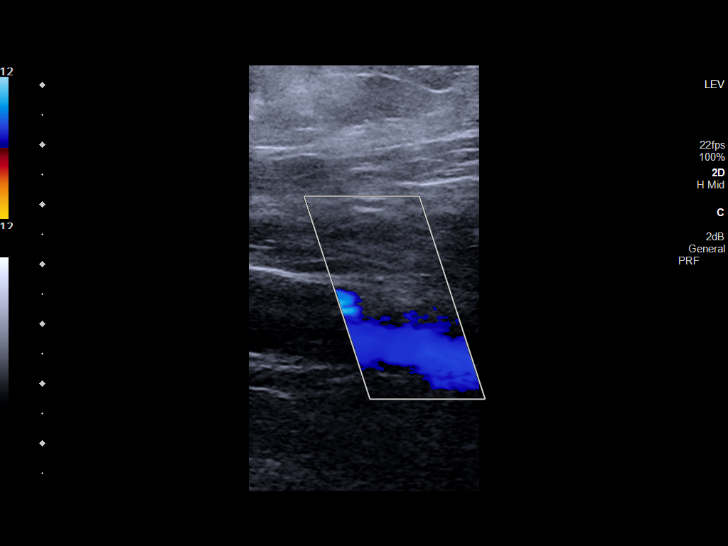
[im 43/59]
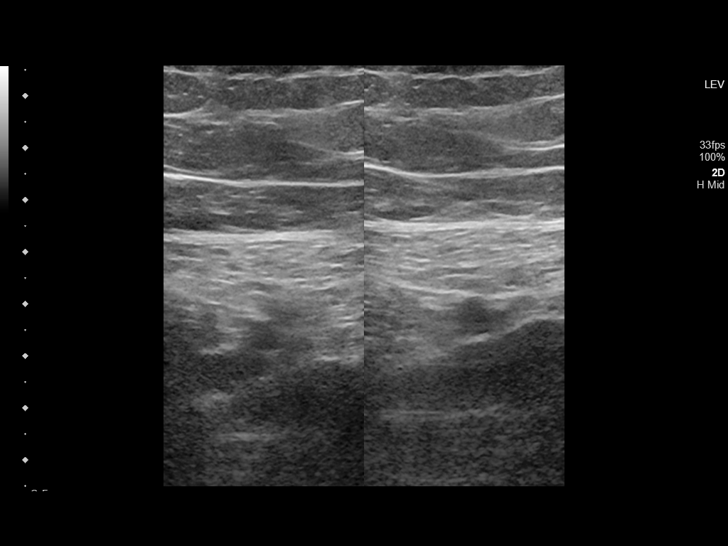
[im 48/59]
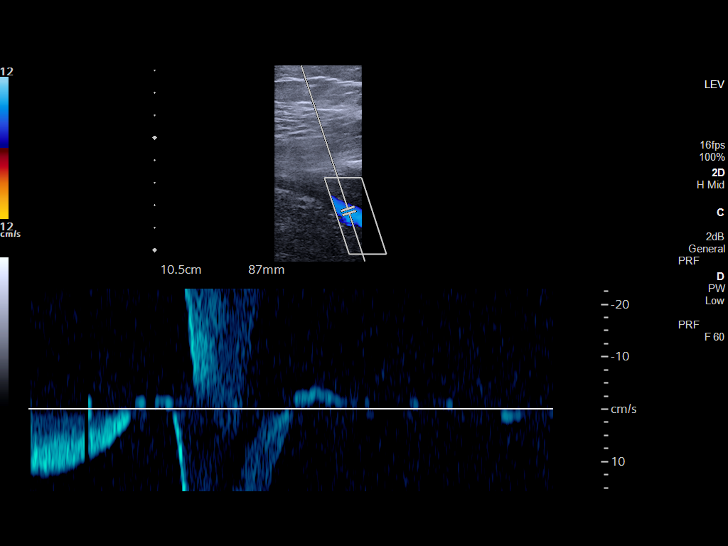
[im 53/59]
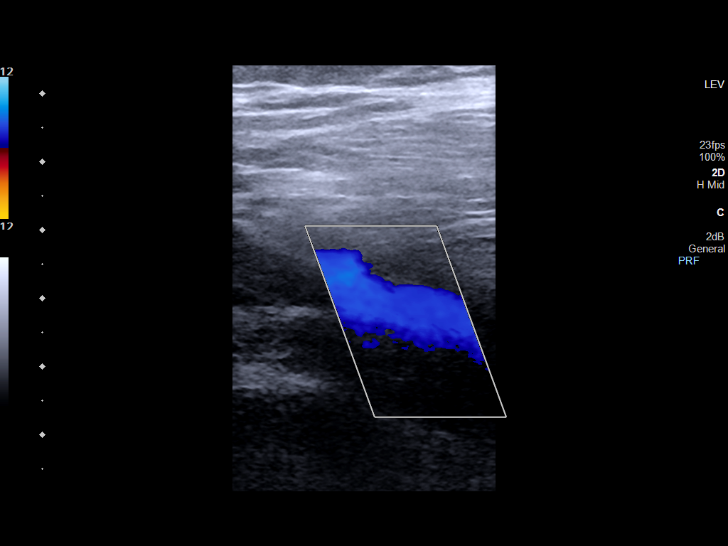
[im 59/59]
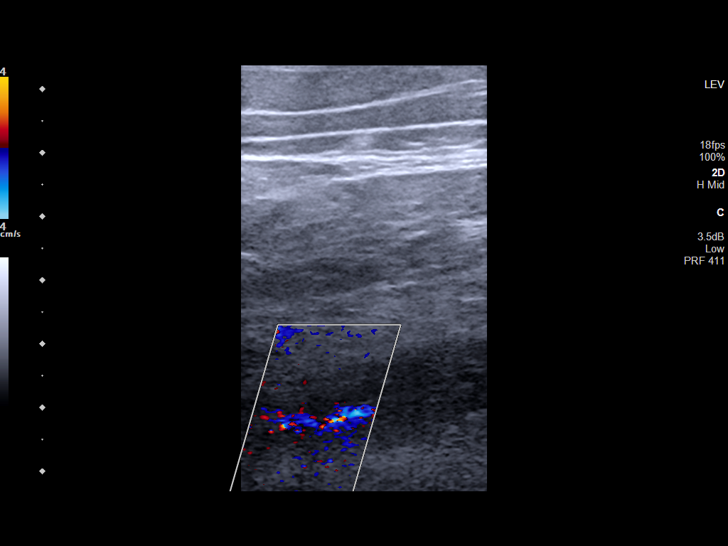

[13 of 24 positions shown; findings below may reference images not displayed]

FINDINGS: RIGHT LOWER EXTREMITY

Common Femoral Vein: No evidence of thrombus. Normal
compressibility, respiratory phasicity and response to augmentation.

Saphenofemoral Junction: No evidence of thrombus. Normal
compressibility and flow on color Doppler imaging.

Profunda Femoral Vein: No evidence of thrombus. Normal
compressibility and flow on color Doppler imaging.

Femoral Vein: No evidence of thrombus. Normal compressibility,
respiratory phasicity and response to augmentation.

Popliteal Vein: No evidence of thrombus. Normal compressibility,
respiratory phasicity and response to augmentation.

Calf Veins: No evidence of thrombus. Normal compressibility and flow
on color Doppler imaging.

Superficial Great Saphenous Vein: No evidence of thrombus. Normal
compressibility.

Venous Reflux:  None.

Other Findings:  None.

LEFT LOWER EXTREMITY

Common Femoral Vein: No evidence of thrombus. Normal
compressibility, respiratory phasicity and response to augmentation.

Saphenofemoral Junction: No evidence of thrombus. Normal
compressibility and flow on color Doppler imaging.

Profunda Femoral Vein: No evidence of thrombus. Normal
compressibility and flow on color Doppler imaging.

Femoral Vein: No evidence of thrombus. Normal compressibility,
respiratory phasicity and response to augmentation.

Popliteal Vein: No evidence of thrombus. Normal compressibility,
respiratory phasicity and response to augmentation.

Calf Veins: No evidence of thrombus. Normal compressibility and flow
on color Doppler imaging.

Superficial Great Saphenous Vein: No evidence of thrombus. Normal
compressibility.

Venous Reflux:  None.

Other Findings:  None.
IMPRESSION: No evidence of DVT within either lower extremity.

## 2022-10-17 ENCOUNTER — Encounter (HOSPITAL_COMMUNITY): Payer: Self-pay

## 2022-10-17 ENCOUNTER — Emergency Department (HOSPITAL_COMMUNITY): Payer: Self-pay

## 2022-10-17 ENCOUNTER — Emergency Department (HOSPITAL_COMMUNITY)
Admission: EM | Admit: 2022-10-17 | Discharge: 2022-10-17 | Discharge status: Against Medical Advice | Payer: Self-pay | Attending: Emergency Medicine | Admitting: Emergency Medicine

## 2022-10-17 ENCOUNTER — Other Ambulatory Visit: Payer: Self-pay

## 2022-10-17 DIAGNOSIS — E109 Type 1 diabetes mellitus without complications: Secondary | ICD-10-CM | POA: Insufficient documentation

## 2022-10-17 DIAGNOSIS — Z794 Long term (current) use of insulin: Secondary | ICD-10-CM | POA: Insufficient documentation

## 2022-10-17 DIAGNOSIS — R042 Hemoptysis: Secondary | ICD-10-CM | POA: Insufficient documentation

## 2022-10-17 DIAGNOSIS — I2699 Other pulmonary embolism without acute cor pulmonale: Secondary | ICD-10-CM | POA: Insufficient documentation

## 2022-10-17 LAB — CBC WITH DIFFERENTIAL/PLATELET
Abs Immature Granulocytes: 0.02 10*3/uL (ref 0.00–0.07)
Basophils Absolute: 0 10*3/uL (ref 0.0–0.1)
Basophils Relative: 0 %
Eosinophils Absolute: 0.3 10*3/uL (ref 0.0–0.5)
Eosinophils Relative: 3 %
HCT: 48.1 % (ref 39.0–52.0)
Hemoglobin: 16.2 g/dL (ref 13.0–17.0)
Immature Granulocytes: 0 %
Lymphocytes Relative: 43 %
Lymphs Abs: 3.3 10*3/uL (ref 0.7–4.0)
MCH: 30.2 pg (ref 26.0–34.0)
MCHC: 33.7 g/dL (ref 30.0–36.0)
MCV: 89.6 fL (ref 80.0–100.0)
Monocytes Absolute: 0.7 10*3/uL (ref 0.1–1.0)
Monocytes Relative: 9 %
Neutro Abs: 3.4 10*3/uL (ref 1.7–7.7)
Neutrophils Relative %: 45 %
Platelets: 240 10*3/uL (ref 150–400)
RBC: 5.37 MIL/uL (ref 4.22–5.81)
RDW: 11.7 % (ref 11.5–15.5)
WBC: 7.7 10*3/uL (ref 4.0–10.5)
nRBC: 0 % (ref 0.0–0.2)

## 2022-10-17 LAB — BASIC METABOLIC PANEL
Anion gap: 10 (ref 5–15)
BUN: 7 mg/dL (ref 6–20)
CO2: 22 mmol/L (ref 22–32)
Calcium: 9 mg/dL (ref 8.9–10.3)
Chloride: 104 mmol/L (ref 98–111)
Creatinine, Ser: 0.75 mg/dL (ref 0.61–1.24)
GFR, Estimated: >60 mL/min (ref >60)
Glucose, Bld: 98 mg/dL (ref 70–99)
Potassium: 4 mmol/L (ref 3.5–5.1)
Sodium: 136 mmol/L (ref 135–145)

## 2022-10-17 LAB — BRAIN NATRIURETIC PEPTIDE: B Natriuretic Peptide: 16.3 pg/mL (ref 0.0–100.0)

## 2022-10-17 LAB — TROPONIN I (HIGH SENSITIVITY): Troponin I (High Sensitivity): 9 ng/L (ref <18)

## 2022-10-17 LAB — CBG MONITORING, ED: Glucose-Capillary: 86 mg/dL (ref 70–99)

## 2022-10-17 MED ORDER — IOHEXOL 350 MG/ML SOLN
75.0000 mL | Freq: Once | INTRAVENOUS | Status: AC | PRN
  Administered 2022-10-17: 75 mL via INTRAVENOUS

## 2022-10-17 NOTE — ED Triage Notes (Signed)
Pt c/o coughing up blood tinged mucousx2d. Pt denies blood thinners.

## 2022-10-17 NOTE — ED Provider Notes (Signed)
[place] EMERGENCY DEPARTMENT AT [facility] Provider Note   CSN: [id number] Arrival date & time: 10/17/22  1339     History  Chief Complaint  Patient presents with   Hemoptysis    TRENDEN HAZELRIGG is a 22 y.o. male.  HPI 22 year old male with past medical history of type 1 diabetes, pulmonary emboli (currently not on anticoagulation) presenting for evaluation of hemoptysis.  Patient states that for the last 2 to 3 days he has been coughing up mucus mixed with blood.  Denies any fevers, chest pain, shortness of breath, abdominal pain, pain with urination, urinary frequency.  He notes that his wife is currently undergoing antibiotic treatment for pneumonia.  Of note, patient states that he had a blood clot approximately 1 year ago in his lungs.  He was supposed to be taking Xarelto, but had to stop this medication 9 to 10 months ago due to cost.    Home Medications Prior to Admission medications   Medication Sig Start Date End Date Taking? Authorizing Provider  blood glucose meter kit and supplies KIT Use up to four times daily as directed. 04/06/21   Alford Highland, MD  Blood Glucose Monitoring Suppl (RIGHTEST [id number] BLOOD GLUCOSE) w/Device KIT Use as directed. 04/06/21   Alford Highland, MD  Insulin Lispro (HUMALOG KWIKPEN [place]) Inject 6 Units into the skin with breakfast, with lunch, and with evening meal.    [provider]  Insulin Pen Needle 32G X 4 MM MISC Use as directed with insulin. 04/06/21   Alford Highland, MD  omeprazole (PRILOSEC) 40 MG capsule Take 1 capsule (40 mg total) by mouth once daily. 05/04/21   Georgiana Spinner, NP  rivaroxaban (XARELTO) 20 MG TABS tablet Take 1 tablet (20 mg total) by mouth once daily with supper. 09/02/22   Earna Coder, MD      Allergies    Patient has no known allergies.    Review of Systems   Review of Systems  Physical Exam Updated Vital Signs BP 133/68   Pulse 85   Temp (!) 97.5 F (36.4 C)   Resp 18    Ht 5\' 9"  (1.753 m)   Wt (!) 155.5 kg   SpO2 98%   BMI 50.62 kg/m  Physical Exam Constitutional:      General: He is not in acute distress.    Appearance: He is not ill-appearing.  HENT:     Head: Normocephalic.     Right Ear: External ear normal.     Left Ear: External ear normal.     Nose: Nose normal.     Mouth/Throat:     Mouth: Mucous membranes are moist.     Pharynx: Oropharynx is clear.  Eyes:     Extraocular Movements: Extraocular movements intact.     Conjunctiva/sclera: Conjunctivae normal.     Pupils: Pupils are equal, round, and reactive to light.  Cardiovascular:     Rate and Rhythm: Normal rate and regular rhythm.     Pulses: Normal pulses.     Heart sounds: Normal heart sounds.  Pulmonary:     Effort: Pulmonary effort is normal. No respiratory distress.     Breath sounds: No wheezing.  Abdominal:     General: Abdomen is flat.     Palpations: Abdomen is soft.     Tenderness: There is no abdominal tenderness.  Musculoskeletal:        General: Normal range of motion.     Right lower leg: No  edema.     Left lower leg: No edema.  Skin:    General: Skin is warm and dry.     Findings: No rash.  Neurological:     General: No focal deficit present.     Mental Status: He is alert.     Cranial Nerves: No cranial nerve deficit.     Motor: No weakness.     ED Results / Procedures / Treatments   Labs (all labs ordered are listed, but only abnormal results are displayed) Labs Reviewed  BASIC METABOLIC PANEL  CBC WITH DIFFERENTIAL/PLATELET  BRAIN NATRIURETIC PEPTIDE  CBG MONITORING, ED  TROPONIN I (HIGH SENSITIVITY)  TROPONIN I (HIGH SENSITIVITY)    EKG EKG Interpretation Date/Time:  Thursday October 17 2022 17:17:21 EDT Ventricular Rate:  85 PR Interval:  162 QRS Duration:  92 QT Interval:  370 QTC Calculation: 440 R Axis:   29  Text Interpretation: Normal sinus rhythm Normal ECG When compared with ECG of 04-Apr-2021 13:53, No acute changes  Confirmed by Alvira Monday ([id number]) on 10/17/2022 6:41:17 PM  Radiology CT Angio Chest PE W and/or Wo Contrast  Result Date: 10/17/2022 CLINICAL DATA:  Hemoptysis EXAM: CT ANGIOGRAPHY CHEST WITH CONTRAST TECHNIQUE: Multidetector CT imaging of the chest was performed using the standard protocol during bolus administration of intravenous contrast. Multiplanar CT image reconstructions and MIPs were obtained to evaluate the vascular anatomy. RADIATION DOSE REDUCTION: This exam was performed according to the departmental dose-optimization program which includes automated exposure control, adjustment of the mA and/or kV according to patient size and/or use of iterative reconstruction technique. CONTRAST:  75mL OMNIPAQUE IOHEXOL 350 MG/ML SOLN COMPARISON:  Chest x-ray 10/17/2022, CT chest 06/05/2021 FINDINGS: Cardiovascular: Satisfactory opacification of the pulmonary arteries to the segmental level. Positive for acute nonocclusive embolus in the right pulmonary artery with smaller volume thrombus in the descending right lobar pulmonary artery with additional small thrombus in right upper lobar and segmental pulmonary vessels. Some wall thickening of right upper and lower lobe pulmonary arteries likely sequela of the patient's previous PE. Additional thrombus within distal descending left lobar pulmonary artery and extension of thrombus into segmental and subsegmental left lower lobe pulmonary vessels. Globular filling defect in the upper right ventricle/base of pulmonary trunk, this measures 2.1 x 1.8 cm and raises concern for upper right ventricular thrombus. Positive for right heart strain with RV LV ratio of 1.0. Borderline cardiomegaly. No pericardial effusion Mediastinum/Nodes: Midline trachea. No thyroid mass. No suspicious lymph nodes. Lungs/Pleura: Lungs are clear. No pleural effusion or pneumothorax. Upper Abdomen: No acute abnormality. Musculoskeletal: No chest wall abnormality. No acute or significant  osseous findings. Review of the MIP images confirms the above findings. IMPRESSION: 1. Positive for acute bilateral pulmonary emboli including thrombus within the right main pulmonary artery. Positive for right heart strain with RV LV ratio of 1.0. 2. Globular filling defect in the upper right ventricle/base of pulmonary trunk, this measures 2.1 x 1.8 cm and raises concern for upper right ventricular thrombus. Suggest correlation with echocardiogram 3. Lungs are clear. Critical Value/emergent results were called by telephone at the time of interpretation on 10/17/2022 at 8:04 pm to provider [facility] , who verbally acknowledged these results. Electronically Signed   By: Jasmine Pang M.D.   On: 10/17/2022 20:04   DG Chest 2 View  Result Date: 10/17/2022 CLINICAL DATA:  Hemoptysis EXAM: CHEST - 2 VIEW COMPARISON:  None Available. FINDINGS: The lungs are clear and negative for focal airspace consolidation, pulmonary edema or suspicious  pulmonary nodule. No pleural effusion or pneumothorax. Cardiac and mediastinal contours are within normal limits. No acute fracture or lytic or blastic osseous lesions. The visualized upper abdominal bowel gas pattern is unremarkable. IMPRESSION: Normal chest x-ray. Electronically Signed   By: Malachy Moan M.D.   On: 10/17/2022 16:15    Procedures Procedures    Medications Ordered in ED Medications  iohexol (OMNIPAQUE) 350 MG/ML injection 75 mL (75 mLs Intravenous Contrast Given 10/17/22 1800)    ED Course/ Medical Decision Making/ A&P                                 Medical Decision Making Amount and/or Complexity of Data Reviewed Labs: ordered. Radiology: ordered.  Risk Prescription drug management.   22 year old male with past medical history and HPI as above.  Vital signs stable upon arrival, notably, patient is afebrile with normal respiratory rate and oxygenation on room air.  No respiratory distress.  Nontoxic-appearing.  Differential  diagnosis includes: pneumonia, CHF exacerbation, ACS, pulmonary embolism, pneumothorax. Unlikely PNA as CXR clear, no leukocytosis, no fever.  Unlikely CHF exacerbation as no history of heart failure and has a BNP of only 16. Unlikely ACS as troponin 9, EKG nonischemic (normal sinus rhythm.  No concerning ST segment elevations.  Questionable T wave inversion isolated to lead III). Doubt Aortic Dissection, Pancreatitis, Pneumothorax, Arrhythmia, Endo/Myo/Pericarditis, Esophageal pathology, or other Emergent pathology.  Given patient's history of unprovoked pulmonary emboli and being unable to afford anticoagulation, concern is raised for recurrent pulmonary emboli leading to his hemoptysis.  CTA PE study.  This study was obtained, but prior to formal radiology read, patient expressed desire to leave.  My attending and I spoke with the patient about the need to stay due to concern for pulmonary emboli and risk of deterioration, including death.  He initially expressed understanding, but when the results had finalized patient had actually left the emergency department.  CT scan did demonstrate acute bilateral pulmonary emboli with signs of right heart strain.  Myself as well as my attending and the nursing staff attempted to call the patient on multiple occasions, leaving voicemails.  We also reach out to relative and spouse phone numbers listed in the chart, but were unsuccessful at making contact.  Patient ultimately eloped prior to treatment being completed.   The plan for this patient was discussed with Dr. Dalene Seltzer, who voiced agreement and who oversaw evaluation and treatment of this patient.     Final Clinical Impression(s) / ED Diagnoses Final diagnoses:  Hemoptysis  Acute pulmonary embolism, unspecified pulmonary embolism type, unspecified whether acute cor pulmonale present [facility])    Rx / DC Orders ED Discharge Orders     None         Lyman Speller, MD 10/17/22 2252     Alvira Monday, MD 10/19/22 [phone]

## 2022-10-17 NOTE — ED Notes (Signed)
Pt has eloped. RN called VM and left message to call with crititcal results.

## 2022-10-17 NOTE — Progress Notes (Signed)
ANTICOAGULATION CONSULT NOTE - Initial Consult  Pharmacy Consult for heparin Indication: pulmonary embolus  No Known Allergies  Patient Measurements: Height: 5\' 9"  (175.3 cm) Weight: (!) 155.5 kg (342 lb 13 oz) IBW/kg (Calculated) : 70.7 Heparin Dosing Weight: 108kg  Vital Signs: Temp: 97.5 F (36.4 C) (09/26 1817) Temp Source: Oral (09/26 1354) BP: 133/68 (09/26 1817) Pulse Rate: 85 (09/26 1817)  Labs: Recent Labs    10/17/22 1356 10/17/22 1358  HGB 16.2  --   HCT 48.1  --   PLT 240  --   CREATININE 0.75  --   TROPONINIHS  --  9    Estimated Creatinine Clearance: 212.5 mL/min (by C-G formula based on SCr of 0.75 mg/dL).   Medical History: Past Medical History:  Diagnosis Date   Diabetes mellitus without complication (HCC)    Pulmonary embolism (HCC)     Assessment: 5 YOM presenting with hemoptysis, CT angio chest shows PE with RHS, he has hx of same on Xarelto PTA but reportedly has not been taking recently  Goal of Therapy:  Heparin level 0.3-0.7 units/ml Monitor platelets by anticoagulation protocol: Yes   Plan:  Heparin 7000 units IV x 1, and gtt at 2000 units/hr F/u 6 hour heparin level F/u long term [facility] plan  Daylene Posey, PharmD, [facility] Clinical Pharmacist ED Pharmacist Phone # [phone] 10/17/2022 8:06 PM

## 2022-10-17 NOTE — ED Notes (Signed)
Pt requesting to leave- states ride is here .IV removed.  EDP informed.

## 2023-03-05 ENCOUNTER — Inpatient Hospital Stay: Payer: Self-pay

## 2023-03-05 ENCOUNTER — Inpatient Hospital Stay: Payer: Self-pay | Attending: Internal Medicine | Admitting: Internal Medicine

## 2023-03-05 ENCOUNTER — Encounter: Payer: Self-pay | Admitting: Internal Medicine

## 2023-03-05 NOTE — Progress Notes (Deleted)
 [facility] CONSULT NOTE  Patient Care Team: Patient, No Pcp Per as PCP - General (General Practice) Earna Coder, MD as Consulting Physician (Oncology)  CHIEF COMPLAINTS/PURPOSE OF CONSULTATION: DVT/PE  #  IMPRESSION: 1. Positive for acute PE with CT evidence of right heart straining (RV/LV Ratio = 1.2) consistent with at least submassive (intermediate risk) PE. The presence of right heart strain has been associated with an increased risk of morbidity and mortality. 2. Fatty liver.   These results were called by telephone at the time of interpretation on 04/05/2021 at 2:25 am to nurse practitioner, Jon Billings, who verbally acknowledged these results.      On: 04/05/2021 02:43  # RIGHT submassive pulm embolism -heparin s/p evaluation with vascular surgery [Dr.Schneir] status post thrombectomy- on Xarelto; MARCH 2023-hypercoagulable work-up-unremarkable protein C&S normal prothrombin gene; factor V Leiden; Antithrombin III; abnormal DRVVT [on Xarelto could be false positive]; IgM anticardiolipin slightly positive; beta-2 glycoprotein negative.   # BI  LE Dopplers- NEGATIVE.   # DKA [march 2023]; morbid obesity;    Oncology History   No history exists.     HISTORY OF PRESENTING ILLNESS: Ambulating independently.  With girl friend.    Tony Malone 23 y.o.  male recent history of right lung submassive PE on Xarelto; and DM on insulin  is here for follow-up.  Patient needs refills on xarelto, pending.   Denies any visible bleeding from his blood thinner. Patient states to be non-compliant with his Xarelto.   He states he is also needing refills on his 2  insulins, he doesn't know the name of one of his insulins.  Patient states his blood sugars are better controlled.   Review of Systems  Constitutional:  Negative for chills, diaphoresis, fever, malaise/fatigue and weight loss.  HENT:  Negative for nosebleeds and sore throat.   Eyes:  Negative for  double vision.  Respiratory:  Negative for cough, hemoptysis, sputum production, shortness of breath and wheezing.   Cardiovascular:  Negative for chest pain, palpitations, orthopnea and leg swelling.  Gastrointestinal:  Negative for abdominal pain, blood in stool, constipation, diarrhea, heartburn, melena, nausea and vomiting.  Genitourinary:  Negative for dysuria, frequency and urgency.  Musculoskeletal:  Negative for back pain and joint pain.  Skin: Negative.  Negative for itching and rash.  Neurological:  Positive for tingling. Negative for dizziness, focal weakness, weakness and headaches.  Endo/Heme/Allergies:  Does not bruise/bleed easily.  Psychiatric/Behavioral:  Negative for depression. The patient is not nervous/anxious and does not have insomnia.      MEDICAL HISTORY:  Past Medical History:  Diagnosis Date  . Diabetes mellitus without complication (HCC)   . Pulmonary embolism (HCC)     SURGICAL HISTORY: Past Surgical History:  Procedure Laterality Date  . PULMONARY THROMBECTOMY Right 04/05/2021   Procedure: PULMONARY THROMBECTOMY;  Surgeon: Renford Dills, MD;  Location: [facility] INVASIVE CV LAB;  Service: Cardiovascular;  Laterality: Right;    SOCIAL HISTORY: Social History   Socioeconomic History  . Marital status: Single    Spouse name: Not on file  . Number of children: Not on file  . Years of education: Not on file  . Highest education level: Not on file  Occupational History  . Not on file  Tobacco Use  . Smoking status: Never  . Smokeless tobacco: Never  Vaping Use  . Vaping status: Never Used  Substance and Sexual Activity  . Alcohol use: Never  . Drug use: No  . Sexual activity: Yes  Other Topics Concern  . Not on file  Social History Narrative   Lives in [place]; no children; debt collector; no smoking; rare alcohol.    Social Drivers of Health   Financial Resource Strain: Medium Risk (10/21/2022)   Received from [facility]   Overall  Financial Resource Strain (CARDIA)   . Difficulty of Paying Living Expenses: Somewhat hard  Food Insecurity: No Food Insecurity (10/22/2022)   Received from [facility]   Hunger Vital Sign   . Worried About Programme researcher, broadcasting/film/video in the Last Year: Never true   . Ran Out of Food in the Last Year: Never true  Transportation Needs: No Transportation Needs (10/22/2022)   Received from [facility]   [facility] - Transportation   . Lack of Transportation (Medical): No   . Lack of Transportation (Non-Medical): No  Physical Activity: Not on file  Stress: Not on file  Social Connections: Not on file  Intimate Partner Violence: Not on file    FAMILY HISTORY: Family History  Problem Relation Age of Onset  . Alopecia Sister   . Cancer Paternal Grandmother     ALLERGIES:  has no known allergies.  MEDICATIONS:  Current Outpatient Medications  Medication Sig Dispense Refill  . blood glucose meter kit and supplies KIT Use up to four times daily as directed. 1 each 0  . Blood Glucose Monitoring Suppl (RIGHTEST [id number] BLOOD GLUCOSE) w/Device KIT Use as directed. 1 kit 0  . Insulin Lispro (HUMALOG KWIKPEN [place]) Inject 6 Units into the skin with breakfast, with lunch, and with evening meal.    . Insulin Pen Needle 32G X 4 MM MISC Use as directed with insulin. 200 each 0  . omeprazole (PRILOSEC) 40 MG capsule Take 1 capsule (40 mg total) by mouth once daily. 30 capsule 3  . rivaroxaban (XARELTO) 20 MG TABS tablet Take 1 tablet (20 mg total) by mouth once daily with supper. 30 tablet 5   No current facility-administered medications for this visit.       PHYSICAL EXAMINATION:  There were no vitals filed for this visit.   There were no vitals filed for this visit.    Physical Exam Vitals and nursing note reviewed.  HENT:     Head: Normocephalic and atraumatic.     Mouth/Throat:     Pharynx: Oropharynx is clear.  Eyes:     Extraocular Movements: Extraocular movements intact.      Pupils: Pupils are equal, round, and reactive to light.  Cardiovascular:     Rate and Rhythm: Normal rate and regular rhythm.  Pulmonary:     Comments: Decreased breath sounds bilaterally.  Abdominal:     Palpations: Abdomen is soft.  Musculoskeletal:        General: Normal range of motion.     Cervical back: Normal range of motion.  Skin:    General: Skin is warm.  Neurological:     General: No focal deficit present.     Mental Status: He is alert and oriented to person, place, and time.  Psychiatric:        Behavior: Behavior normal.        Judgment: Judgment normal.     LABORATORY DATA:  I have reviewed the data as listed Lab Results  Component Value Date   WBC 7.7 10/17/2022   HGB 16.2 10/17/2022   HCT 48.1 10/17/2022   MCV 89.6 10/17/2022   PLT 240 10/17/2022   Recent Labs  03/08/22 1434 10/17/22 1356  NA 134* 136  K 3.7 4.0  CL 104 104  CO2 23 22  GLUCOSE 123* 98  BUN 11 7  CREATININE 0.81 0.75  CALCIUM 8.8* 9.0  GFRNONAA >60 >60  PROT 7.5  --   ALBUMIN 3.8  --   AST 16  --   ALT 18  --   ALKPHOS 58  --   BILITOT 0.5  --     RADIOGRAPHIC STUDIES: I have personally reviewed the radiological images as listed and agreed with the findings in the report. No results found.  ASSESSMENT & PLAN:   Acute pulmonary embolism with acute cor pulmonale (HCC) #Submassive PE right-sided acute [March 16th, 2023]-status post thrombectomy; currently on Xarelto.  Patient symptoms resolved.  Discussed importance of compliance with  Xarelto [see below]  #Etiology: Unclear of the blood clot unclear; history of obesity. hypercoagulable work-up-overall unremarkable except for abnormal lupus anticoagulant/however patient not Xarelto.  REPEAT APS work up-APRIL 2024- NEGATIVE.  Given the morbid obesity; and the unprovoked nature of his blood clot-I would recommend indefinite anticoagulation. Will refill for 6 months.   # Right LE numbess-unclear etiology [Doppler end of  March 2023 negative]-likely diabetes -stable.    #Diabetes/DKA 2023-continue insulin; follow-up with PCP/difficulty with PCP appointments.   # I had a long discussion with the patient regarding the risk of bleeding while on anticoagulation.  Patient counseled regarding taking at most care to avoid falls/hitting head. The patient should inform us of any bleeding episodes; or report to emergency room if any significant bleeding is noticed.   # DISPOSITION: # return PCP re: DM management # follow up in 6  month-- - MD; labs- cbc/cmp; d-dimer- Dr.B  All questions were answered. The patient knows to call the clinic with any problems, questions or concerns.    Earna Coder, MD 03/05/2023 10:27 AM

## 2023-03-05 NOTE — Assessment & Plan Note (Deleted)
 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  Patient symptoms resolved.  Discussed importance of compliance with  Xarelto [see below]  #Etiology: Unclear of the blood clot unclear; history of obesity. hypercoagulable work-up-overall unremarkable except for abnormal lupus anticoagulant/however patient not Xarelto.  REPEAT APS work up-APRIL 2024- NEGATIVE.  Given the morbid obesity; and the unprovoked nature of his blood clot-I would recommend indefinite anticoagulation. Will refill for 6 months.   # Right LE numbess-unclear etiology [Doppler end of March 2023 negative]-likely diabetes -stable.    #Diabetes/DKA 2023-continue insulin; follow-up with PCP/difficulty with PCP appointments.   # I had a long discussion with the patient regarding the risk of bleeding while on anticoagulation.  Patient counseled regarding taking at most care to avoid falls/hitting head. The patient should inform us of any bleeding episodes; or report to emergency room if any significant bleeding is noticed.   # DISPOSITION: # return PCP re: DM management # follow up in 6  month-- - MD; labs- cbc/cmp; d-dimer- Dr.B

## 2023-03-28 ENCOUNTER — Telehealth: Payer: Self-pay | Admitting: Internal Medicine

## 2023-03-28 ENCOUNTER — Inpatient Hospital Stay: Payer: Self-pay | Admitting: Internal Medicine

## 2023-03-28 ENCOUNTER — Inpatient Hospital Stay: Payer: Self-pay | Attending: Internal Medicine

## 2023-03-28 ENCOUNTER — Encounter: Payer: Self-pay | Admitting: Internal Medicine

## 2023-03-28 NOTE — Telephone Encounter (Signed)
 Per Dr. Donneta Romberg, dismiss letter was sent to patient do to repetitive no shows to appointment. Letter generated and sent 3/7.0

## 2023-06-10 ENCOUNTER — Encounter (INDEPENDENT_AMBULATORY_CARE_PROVIDER_SITE_OTHER): Payer: Self-pay

## 2023-07-03 ENCOUNTER — Encounter (HOSPITAL_BASED_OUTPATIENT_CLINIC_OR_DEPARTMENT_OTHER): Payer: Self-pay | Admitting: Emergency Medicine

## 2023-07-03 ENCOUNTER — Emergency Department (HOSPITAL_BASED_OUTPATIENT_CLINIC_OR_DEPARTMENT_OTHER): Payer: Self-pay | Admitting: Radiology

## 2023-07-03 ENCOUNTER — Other Ambulatory Visit: Payer: Self-pay

## 2023-07-03 ENCOUNTER — Emergency Department (HOSPITAL_BASED_OUTPATIENT_CLINIC_OR_DEPARTMENT_OTHER)
Admission: EM | Admit: 2023-07-03 | Discharge: 2023-07-03 | Disposition: A | Discharge status: Home / Self Care | Payer: Self-pay | Attending: Emergency Medicine | Admitting: Emergency Medicine

## 2023-07-03 DIAGNOSIS — E119 Type 2 diabetes mellitus without complications: Secondary | ICD-10-CM | POA: Insufficient documentation

## 2023-07-03 DIAGNOSIS — Z7901 Long term (current) use of anticoagulants: Secondary | ICD-10-CM | POA: Insufficient documentation

## 2023-07-03 DIAGNOSIS — R000 Tachycardia, unspecified: Secondary | ICD-10-CM | POA: Insufficient documentation

## 2023-07-03 DIAGNOSIS — J189 Pneumonia, unspecified organism: Secondary | ICD-10-CM | POA: Insufficient documentation

## 2023-07-03 DIAGNOSIS — Z794 Long term (current) use of insulin: Secondary | ICD-10-CM | POA: Insufficient documentation

## 2023-07-03 DIAGNOSIS — Z79899 Other long term (current) drug therapy: Secondary | ICD-10-CM | POA: Insufficient documentation

## 2023-07-03 LAB — CBG MONITORING, ED: Glucose-Capillary: 107 mg/dL — ABNORMAL HIGH (ref 70–99)

## 2023-07-03 LAB — RESP PANEL BY RT-PCR (RSV, FLU A&B, COVID)  RVPGX2
Influenza A by PCR: NEGATIVE
Influenza B by PCR: NEGATIVE
Resp Syncytial Virus by PCR: NEGATIVE
SARS Coronavirus 2 by RT PCR: NEGATIVE

## 2023-07-03 LAB — GROUP A STREP BY PCR: Group A Strep by PCR: NOT DETECTED

## 2023-07-03 MED ORDER — DOXYCYCLINE HYCLATE 100 MG PO CAPS: 100.0000 mg | ORAL_CAPSULE | Freq: Two times a day (BID) | ORAL | 0 refills | Status: AC

## 2023-07-03 MED ORDER — ACETAMINOPHEN 500 MG PO TABS
1000.0000 mg | ORAL_TABLET | Freq: Once | ORAL | Status: AC
  Administered 2023-07-03: 1000 mg via ORAL
  Filled 2023-07-03: qty 2

## 2023-07-03 MED ORDER — GUAIFENESIN 100 MG/5ML PO LIQD: 5.0000 mL | ORAL | 0 refills | Status: AC | PRN

## 2023-07-03 MED ORDER — DOXYCYCLINE HYCLATE 100 MG PO TABS
100.0000 mg | ORAL_TABLET | Freq: Once | ORAL | Status: AC
  Administered 2023-07-03: 100 mg via ORAL
  Filled 2023-07-03: qty 1

## 2023-07-03 NOTE — ED Triage Notes (Signed)
 Pt c/o productive cough, sore throat, headache, and burning sensation in the chest x2 days. Household members with similar s/s.

## 2023-07-03 NOTE — ED Provider Notes (Signed)
 Tony Malone Provider Note   CSN: [id number] Arrival date & time: 07/03/23  5284     Patient presents with: Cough   Tony Malone is a 23 y.o. male.   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  Pt said he has been compliant with his medication.  He's had a cough and sore throat for the past 2 days.  His significant other has similar sx.  Pt denies fevers.         Prior to Admission medications   Medication Sig Start Date End Date Taking? Authorizing Provider  doxycycline (VIBRAMYCIN) 100 MG capsule Take 1 capsule (100 mg total) by mouth 2 (two) times daily. 07/03/23  Yes Jaiya Mooradian, MD  guaiFENesin (ROBITUSSIN) 100 MG/5ML liquid Take 5 mLs by mouth every 4 (four) hours as needed for cough or to loosen phlegm. 07/03/23  Yes Sueellen Emery, MD  blood glucose meter kit and supplies KIT Use up to four times daily as directed. 04/06/21   Verla Glaze, MD  Blood Glucose Monitoring Suppl (RIGHTEST [id number] BLOOD GLUCOSE) w/Device KIT Use as directed. 04/06/21   Verla Glaze, MD  Insulin  Lispro (HUMALOG  KWIKPEN [place]) Inject 6 Units into the skin with breakfast, with lunch, and with evening meal.    [provider]  Insulin  Pen Needle 32G X 4 MM MISC Use as directed with insulin . 04/06/21   Verla Glaze, MD  omeprazole  (PRILOSEC) 40 MG capsule Take 1 capsule (40 mg total) by mouth once daily. 05/04/21   Brown, Fallon E, NP  rivaroxaban  (XARELTO ) 20 MG TABS tablet Take 1 tablet (20 mg total) by mouth once daily with supper. 09/02/22   Brahmanday, Govinda R, MD    Allergies: Patient has no known allergies.    Review of Systems  Respiratory:  Positive for cough and shortness of breath.   All other systems reviewed and are negative.   Updated Vital Signs BP (!) 145/68   Pulse (!) 111   Temp 98.9 F (37.2 C) (Oral)   Resp 20   Ht 5' 9 (1.753 m)   Wt (!) 140.6 kg    SpO2 95%   BMI 45.78 kg/m   Physical Exam Vitals and nursing note reviewed.  Constitutional:      Appearance: Normal appearance.  HENT:     Head: Normocephalic and atraumatic.     Right Ear: External ear normal.     Left Ear: External ear normal.     Nose: Nose normal.     Mouth/Throat:     Mouth: Mucous membranes are dry.   Eyes:     Extraocular Movements: Extraocular movements intact.     Conjunctiva/sclera: Conjunctivae normal.     Pupils: Pupils are equal, round, and reactive to light.    Cardiovascular:     Rate and Rhythm: Regular rhythm. Tachycardia present.     Pulses: Normal pulses.     Heart sounds: Normal heart sounds.  Pulmonary:     Effort: Pulmonary effort is normal.     Breath sounds: Normal breath sounds.  Abdominal:     General: Abdomen is flat.     Palpations: Abdomen is soft.   Musculoskeletal:        General: Normal range of motion.     Cervical back: Normal range of motion and neck supple.   Skin:    General: Skin is warm.     Capillary Refill:  Capillary refill takes less than 2 seconds.   Neurological:     General: No focal deficit present.     Mental Status: He is alert and oriented to person, place, and time.   Psychiatric:        Mood and Affect: Mood normal.        Behavior: Behavior normal.     (all labs ordered are listed, but only abnormal results are displayed) Labs Reviewed  CBG MONITORING, ED - Abnormal; Notable for the following components:      Result Value   Glucose-Capillary 107 (*)    All other components within normal limits  RESP PANEL BY RT-PCR (RSV, FLU A&B, COVID)  [id number]  GROUP A STREP BY PCR    EKG: None  Radiology: DG Chest 2 View Result Date: 07/03/2023 CLINICAL DATA:  Cough EXAM: CHEST - 2 VIEW COMPARISON:  October 17 2022 FINDINGS: Ill-defined pulmonary interstitial infiltrates more prominent in the left perihilar region without consolidations could correlate with a viral pneumonia or pneumonitis.  IMPRESSION: Infiltrates as described Electronically Signed   By: Fredrich Jefferson M.D.   On: 07/03/2023 08:38     Procedures   Medications Ordered in the ED  doxycycline (VIBRA-TABS) tablet 100 mg (has no administration in time range)  acetaminophen  (TYLENOL ) tablet 1,000 mg (1,000 mg Oral Given 07/03/23 0805)                                    Medical Decision Making Amount and/or Complexity of Data Reviewed Radiology: ordered.  Risk OTC drugs. Prescription drug management.   This patient presents to the ED for concern of cough, sinus congestion, this involves an extensive number of treatment options, and is a complaint that carries with it a high risk of complications and morbidity.  The differential diagnosis includes covid/flu/rsv, pna, sinus infection   Co morbidities that complicate the patient evaluation  DM and PE   Additional history obtained:  Additional history obtained from epic chart review  Lab Tests:  I Ordered, and personally interpreted labs.  The pertinent results include:  strep neg, covid/flu/rsv neg; cbg 107   Imaging Studies ordered:  I ordered imaging studies including cxr  I independently visualized and interpreted imaging which showed  Ill-defined pulmonary interstitial infiltrates more prominent in the  left perihilar region without consolidations could correlate with a  viral pneumonia or pneumonitis.    IMPRESSION:  Infiltrates as described   I agree with the radiologist interpretation  Medicines ordered and prescription drug management:  I ordered medication including tylenol /doxy  for sx  Reevaluation of the patient after these medicines showed that the patient improved I have reviewed the patients home medicines and have made adjustments as needed   Test Considered:  ct   Critical Interventions:  abx   Problem List / ED Course:  CAP:  pt started on doxy.  He is not hypoxic and is stable for d/c.  He has a pcp appt at  the end of June in [place].  He is to return if worse.    Reevaluation:  After the interventions noted above, I reevaluated the patient and found that they have :improved   Social Determinants of Health:  Lives at home   Dispostion:  After consideration of the diagnostic results and the patients response to treatment, I feel that the patent would benefit from discharge with outpatient f/u.  Final diagnoses:  Community acquired pneumonia, unspecified laterality    ED Discharge Orders          Ordered    doxycycline (VIBRAMYCIN) 100 MG capsule  2 times daily        07/03/23 0859    guaiFENesin (ROBITUSSIN) 100 MG/5ML liquid  Every 4 hours PRN        07/03/23 0859               Keeanna Villafranca, MD 07/03/23 0901

## 2024-01-30 ENCOUNTER — Other Ambulatory Visit: Payer: Self-pay

## 2024-01-30 ENCOUNTER — Emergency Department (HOSPITAL_COMMUNITY)
Admission: EM | Admit: 2024-01-30 | Discharge: 2024-01-30 | Discharge status: Against Medical Advice | Payer: Self-pay | Attending: Emergency Medicine | Admitting: Emergency Medicine

## 2024-01-30 ENCOUNTER — Emergency Department (HOSPITAL_COMMUNITY): Payer: Self-pay

## 2024-01-30 ENCOUNTER — Encounter (HOSPITAL_COMMUNITY): Payer: Self-pay

## 2024-01-30 DIAGNOSIS — S40811A Abrasion of right upper arm, initial encounter: Secondary | ICD-10-CM | POA: Insufficient documentation

## 2024-01-30 DIAGNOSIS — R0789 Other chest pain: Secondary | ICD-10-CM | POA: Insufficient documentation

## 2024-01-30 DIAGNOSIS — M25561 Pain in right knee: Secondary | ICD-10-CM | POA: Insufficient documentation

## 2024-01-30 DIAGNOSIS — Z5321 Procedure and treatment not carried out due to patient leaving prior to being seen by health care provider: Secondary | ICD-10-CM | POA: Insufficient documentation

## 2024-01-30 LAB — BASIC METABOLIC PANEL WITH GFR
Anion gap: 15 (ref 5–15)
BUN: 11 mg/dL (ref 6–20)
CO2: 19 mmol/L — ABNORMAL LOW (ref 22–32)
Calcium: 9.4 mg/dL (ref 8.9–10.3)
Chloride: 104 mmol/L (ref 98–111)
Creatinine, Ser: 0.82 mg/dL (ref 0.61–1.24)
GFR, Estimated: >60 mL/min
Glucose, Bld: 101 mg/dL — ABNORMAL HIGH (ref 70–99)
Potassium: 4.2 mmol/L (ref 3.5–5.1)
Sodium: 139 mmol/L (ref 135–145)

## 2024-01-30 LAB — TROPONIN T, HIGH SENSITIVITY: Troponin T High Sensitivity: <15 ng/L (ref 0–19)

## 2024-01-30 LAB — CBC WITH DIFFERENTIAL/PLATELET
Abs Immature Granulocytes: 0.04 K/uL (ref 0.00–0.07)
Basophils Absolute: 0 K/uL (ref 0.0–0.1)
Basophils Relative: 1 %
Eosinophils Absolute: 0 K/uL (ref 0.0–0.5)
Eosinophils Relative: 0 %
HCT: 50.1 % (ref 39.0–52.0)
Hemoglobin: 17 g/dL (ref 13.0–17.0)
Immature Granulocytes: 1 %
Lymphocytes Relative: 28 %
Lymphs Abs: 2.1 K/uL (ref 0.7–4.0)
MCH: 31.3 pg (ref 26.0–34.0)
MCHC: 33.9 g/dL (ref 30.0–36.0)
MCV: 92.1 fL (ref 80.0–100.0)
Monocytes Absolute: 0.6 K/uL (ref 0.1–1.0)
Monocytes Relative: 8 %
Neutro Abs: 4.6 K/uL (ref 1.7–7.7)
Neutrophils Relative %: 62 %
Platelets: 235 K/uL (ref 150–400)
RBC: 5.44 MIL/uL (ref 4.22–5.81)
RDW: 12.5 % (ref 11.5–15.5)
WBC: 7.4 K/uL (ref 4.0–10.5)
nRBC: 0 % (ref 0.0–0.2)
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
## 2024-01-30 NOTE — ED Notes (Signed)
 PT left AMA
